# Patient Record
Sex: Male | Born: 1960 | Race: Black or African American | Hispanic: No | Marital: Single | Smoking: Former smoker
Health system: Southern US, Community
[De-identification: ages and names within clinical notes are randomized; demographics above are authoritative.]

## PROBLEM LIST (undated history)

## (undated) DIAGNOSIS — F259 Schizoaffective disorder, unspecified: Secondary | ICD-10-CM

## (undated) DIAGNOSIS — I471 Supraventricular tachycardia: Secondary | ICD-10-CM

## (undated) DIAGNOSIS — E119 Type 2 diabetes mellitus without complications: Secondary | ICD-10-CM

## (undated) DIAGNOSIS — K257 Chronic gastric ulcer without hemorrhage or perforation: Secondary | ICD-10-CM

## (undated) DIAGNOSIS — I4892 Unspecified atrial flutter: Secondary | ICD-10-CM

## (undated) DIAGNOSIS — I493 Ventricular premature depolarization: Secondary | ICD-10-CM

## (undated) HISTORY — DX: Type 2 diabetes mellitus without complications: E11.9

## (undated) HISTORY — DX: Ventricular premature depolarization: I49.3

## (undated) HISTORY — DX: Schizoaffective disorder, unspecified: F25.9

## (undated) HISTORY — DX: Unspecified atrial flutter: I48.92

## (undated) HISTORY — DX: Chronic gastric ulcer without hemorrhage or perforation: K25.7

## (undated) HISTORY — DX: Supraventricular tachycardia: I47.1

---

## 1998-02-07 ENCOUNTER — Other Ambulatory Visit: Admission: RE | Admit: 1998-02-07 | Discharge: 1998-02-07 | Payer: Self-pay | Admitting: Family Medicine

## 2000-08-31 ENCOUNTER — Encounter: Payer: Self-pay | Admitting: Family Medicine

## 2000-08-31 ENCOUNTER — Ambulatory Visit (HOSPITAL_COMMUNITY): Admission: RE | Admit: 2000-08-31 | Discharge: 2000-08-31 | Payer: Self-pay | Admitting: Ophthalmology

## 2002-02-20 ENCOUNTER — Emergency Department (HOSPITAL_COMMUNITY): Admission: EM | Admit: 2002-02-20 | Discharge: 2002-02-20 | Payer: Self-pay | Admitting: Emergency Medicine

## 2002-05-17 ENCOUNTER — Encounter: Payer: Self-pay | Admitting: Nephrology

## 2002-05-17 ENCOUNTER — Encounter: Admission: RE | Admit: 2002-05-17 | Discharge: 2002-05-17 | Payer: Self-pay | Admitting: Nephrology

## 2003-03-06 ENCOUNTER — Emergency Department (HOSPITAL_COMMUNITY): Admission: EM | Admit: 2003-03-06 | Discharge: 2003-03-07 | Payer: Self-pay | Admitting: Emergency Medicine

## 2003-03-07 ENCOUNTER — Encounter: Payer: Self-pay | Admitting: Emergency Medicine

## 2004-02-01 ENCOUNTER — Emergency Department (HOSPITAL_COMMUNITY): Admission: EM | Admit: 2004-02-01 | Discharge: 2004-02-01 | Payer: Self-pay | Admitting: Emergency Medicine

## 2005-01-01 ENCOUNTER — Emergency Department (HOSPITAL_COMMUNITY): Admission: EM | Admit: 2005-01-01 | Discharge: 2005-01-01 | Payer: Self-pay | Admitting: Emergency Medicine

## 2005-05-22 ENCOUNTER — Encounter: Admission: RE | Admit: 2005-05-22 | Discharge: 2005-05-22 | Payer: Self-pay | Admitting: Family Medicine

## 2005-06-03 ENCOUNTER — Emergency Department (HOSPITAL_COMMUNITY): Admission: EM | Admit: 2005-06-03 | Discharge: 2005-06-04 | Payer: Self-pay | Admitting: Emergency Medicine

## 2008-01-19 ENCOUNTER — Inpatient Hospital Stay (HOSPITAL_COMMUNITY): Admission: EM | Admit: 2008-01-19 | Discharge: 2008-01-25 | Payer: Self-pay | Admitting: Emergency Medicine

## 2008-01-23 ENCOUNTER — Ambulatory Visit: Payer: Self-pay | Admitting: Infectious Diseases

## 2009-04-17 ENCOUNTER — Ambulatory Visit (HOSPITAL_COMMUNITY): Admission: RE | Admit: 2009-04-17 | Discharge: 2009-04-17 | Payer: Self-pay | Admitting: Urology

## 2009-04-17 ENCOUNTER — Encounter: Payer: Self-pay | Admitting: Urology

## 2010-12-09 NOTE — H&P (Signed)
NAME:  Frederick Fletcher, Frederick Fletcher NO.:  [id number]   MEDICAL RECORD NO.:  [id number]          PATIENT TYPE:  EMS   LOCATION:  ED                           FACILITY:  [facility]   PHYSICIAN:  Hind I Elsaid, MD      DATE OF BIRTH:  1961-01-14   DATE OF ADMISSION:  01/19/2008  DATE OF DISCHARGE:                              HISTORY & PHYSICAL   CHIEF COMPLAINT:  Altered mental status and fever.   HISTORY OF PRESENT ILLNESS:  This is a 50 year old male who lives in  group home with history of schizo-affective, manic depressive episode,  history of gastroesophageal reflux disease. Apparently the patient was  in good health until yesterday when the care giver noticed the patient's  had become so aggressive with some irritability and anger.  Today the  care giver admitted that the patient had some slurred speech and had  some gait imbalance.  The history was mainly obtained from the care  giver secondary to altered mental status.  The patient was seen on the  emergency room, was found to be lethargic.  He was unable to give full  history.  At this time, the patient is awake but noted to have slurred  speech and able to walk as irregular with some dragging of both feet.  The patient denies any back pain, denies any headache.  Denies any  numbness or weakness of his extremities.  He denies any nausea, vomiting  or abdominal pain.  In the emergency room the patient was noted to have  fever of 101 and T-max of 104.6.  No evidence of sweating or shivering.   PAST MEDICAL HISTORY:  1. Schizo-affective disorder.  2. Manic depressive episode.  3. Gastroesophageal reflux disease.  4. 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  5. History of nicotine dependence.   ALLERGIES:  No known drug allergies.   PAST SURGICAL HISTORY:  Unknown to [place].   MEDICATIONS:  1. Allopurinol 10 mg q. 12 hours.  2. Depakote 500 mg q. A.M. and 2tabs p.o. q.h.s. in addition to      Depakote 250 mg p.o. q.h.s.  3. Zyprexa  10 mg in the morning and 2 tabs at bedtime.  4. Ranitidine 150 mg twice daily.  5. Benzatropine 1 mg p.o. q. 8 hours p.r.n. for stiffness.   SOCIAL HISTORY:  The patient lives in a group home.  Denies any drug  abuse but he smokes cigarette about 1 pack per day.  He used to drink  alcohol and take cocaine or marijuana.   PHYSICAL EXAMINATION:  VITAL SIGNS:  T-max was 104.6, blood pressure  94/64, pulse rate 117, respiratory rate 20, SAT 96% on room air.  GENERAL EXAMINATION:  The patient is not in respiratory distress or  shortness of breath, well developed, well nourished, mildly lethargic.  HEENT:  Pupils equal and reactive to light and accommodation.  Eyes:  Normal appearance. Ears:  There is no discharge.  NECK:  Supple, non meningeal sign and no lymphadenopathy.  CARDIOVASCULAR:  S1, S2, no added murmur.  RESPIRATORY:  Equal air entry and no wheezing.  ABDOMEN:  Soft,  nontender.  Bowel sounds are positive.  EXTREMITIES:  No lower limb edema, peripheral pulses intact.  NEUROLOGICAL:  The patient alert to person, able to verbalize but some  slurred speech.  Could not see any evidence of cranial nerve  dysfunction.  He moves all his extremities.  The patient cooperative  with exam.  There is no overly facial droop.  On gait examination, the  patient seemed unable to do wide stepping.   LABORATORY DATA:  Chest x-ray was negative.  Urinalysis negative.  Lactic acid was negative.  Valproic acids are 132.  CBC:  White blood  cell count 9.5, hemoglobin 14.3, hematocrit 41.3, platelet 70.  CMP:  Sodium 136, potassium 4.1, chloride 102, glucose 115, and BUN and  creatinine 14 and 1.9, AST 98 and ALT 65.  Alkaline phosphatase 59.   ASSESSMENT/PLAN:  1. Febrile episode.  2. Altered mental status with slurred speech.  3. Renal insufficiency.  4. Thrombocytopenia.  5. Mild liver function elevation.   ASSESSMENT/PLAN:  1. The patient admitted to telemetry for evaluation of causes of       altered mental status.  Will get septic workup.  Will get flu A&B      and H1,N1 virus.  Will keep the patient on isolation for now.  On      my examination there is no evidence of meningeal sign.  Will get      MRI of the brain and rule out underlying stroke.  Will continue to      advance the patient empirically on Avelox pending septic workup.  2. Renal insufficiency was possibly secondary to dehydration.  Will      hydrate the patient with intravenous fluids.  3. Thrombocytopenia.  Will get peripheral smear, LDH and PT/INR.  4. Mildly elevated LFTs.  Will get hepatitis profile.  5. Schizo-affective disorder, manic depression disorder. Will continue      with home medication.  6. Further recommendations to be addressed as hospital course.      [name], MD  Electronically Signed     HIE/MEDQ  D:  01/19/2008  T:  01/19/2008  Job:  [id number]

## 2010-12-09 NOTE — Discharge Summary (Signed)
NAME:  Frederick Fletcher, COMER NO.:  [id number]   MEDICAL RECORD NO.:  [id number]          PATIENT TYPE:  INP   LOCATION:  1511                         [facility]AC[facility]   PHYSICIAN:  Hillery Aldo, M.D.   DATE OF BIRTH:  [birth year]   DATE OF ADMISSION:  01/19/2008  DATE OF DISCHARGE:  01/25/2008                               DISCHARGE SUMMARY   PRIMARY CARE PHYSICIAN:  Dr. Bruna Potter.   DISCHARGE DIAGNOSES:  1. H1 N1 influenza.  2. Possible [facility] spotted fever.  3. Thrombocytopenia, resolving.  4. Acute renal insufficiency, resolved.  5. Transaminitis, resolved.  6. Schizoaffective bipolar disorder.   DISCHARGE MEDICATIONS:  1. Tamiflu 75 mg b.i.d. x4 days.  2. Doxycycline 100 mg b.i.d. x6 days.  3. Cogentin 1 mg q.8 hours p.r.n.  4. Depakote 500 mg in the morning, 1250 mg in the evening.  5. Haldol 10 mg q.12 hours.  6. Zantac 150 mg b.i.d.  7. Zyprexa 10 mg in the morning, 20 mg in the evening.   CONSULTATION:  1. Dr. Maurice March of Infectious Diseases.  2. Dr. Jeanie Sewer of Psychiatry.   BRIEF ADMISSION HISTORY OF PRESENT ILLNESS:  The patient is a 50-year-  old male resident of a group home who presented to the hospital with  altered mental status and fever.  Was unable to provide any significant  history, but his caregiver noted that he was slurring his speech and was  lethargic.  Upon initial evaluation in the Emergency Department, he was  found to be febrile and therefore was admitted for further evaluation  and workup.  For the full details, please see the dictated report done  by Dr. Eda Fletcher.   PROCEDURES AND DIAGNOSTIC STUDIES:  1. Chest x-ray on January 19, 2008:  Showed no active disease.  2. MRI of the brain on January 20, 2008:  Showed no acute intracranial      abnormality.  Moderate diffuse cerebral and cerebellar atrophy.  3. Chest x-ray on January 20, 2008:  Showed right basilar subsegmental      atelectasis.  4. Chest x-ray on January 21, 2008:  Showed  stable chest.   DISCHARGE LABORATORY VALUES:  Blood cultures were negative x2 sets.  Sodium was 139, potassium 3.7, chloride 105, bicarb 27, glucose 140, BUN  9, creatinine 0.92.  White blood cell count was 5.6, hemoglobin 11.5,  hematocrit 34.4, platelets 125.   HOSPITAL COURSE:  1. Altered mental status/fever secondary to H1 N1 influenza.  The      patient was admitted and a diagnostic workup was undertaken.  H1 N1      PCR was sent off and subsequently came back positive.  The patient      was put on Tamiflu and a septic workup was undertaken as well.  He      initially was empirically treated with Avelox as well.  At this      time, the patient is back to his baseline and has been asymptomatic      for several days.  2. Questionable [facility] spotted fever.  The patient was seen in  consultation with the Infectious Diseases service.  There was      concern that the patient may have [facility] spotted fever due      to his concurrent thrombocytopenia.  [facility] spotted fever      titers were initially negative, but he will need a followup check      of [facility] spotted fever antibodies 8-10 days after illness      onset to rule this out.  Nevertheless, the patient was empirically      treated with doxycycline and will continue a 10-day course of      therapy.  The patient did not exhibit any rash and he is not      currently complaining of any headache.  3. Thrombocytopenia.  The patient did have a precipitous drop in his      platelet count with onset of illness.  His initial platelet count      was 70 and subsequently dropped to a low value of 56.  With      resolution of his symptoms, his platelet count is returning to      normal.  Consideration was made for the possibility that this was      an adverse reaction to Depakote, but this was ruled out as his      platelet count is rising despite continuing therapy with Depakote.      It is likely that his  thrombocytopenia was a sequelae of influenza      illness.  4. Acute renal insufficiency.  The patient was hydrated and his renal      function returned to normal.  5. Transaminitis.  The patient did have transaminitis on initial      evaluation, mild elevation of AST and ALT values.  This is likely      due to underlying illness as his transaminitis trended back towards      baseline values over the course of his hospitalization.  His ALT      has completely normalized and his AST is declining.  6. Schizoaffective bipolar disorder.  The patient was seen in      consultation with Dr. Jeanie Sewer.  He was maintained on psychoactive      medications.  His mood has been stable.   DISPOSITION:  The patient is medically stable for discharge back to the  group home.  He is not considered to be infectious as he has been  maintained in the hospital for 6 days and his symptoms have been  resolved for greater than 24 hours.  No special precautions were  recommended at this time.      Hillery Aldo, M.D.  Electronically Signed     CR/MEDQ  D:  01/25/2008  T:  01/25/2008  Job:  [id number]   cc:   Dr. Bruna Potter

## 2010-12-09 NOTE — Consult Note (Signed)
NAME:  Frederick Fletcher, Frederick Fletcher NO.:  [id number]   MEDICAL RECORD NO.:  [id number]          PATIENT TYPE:  INP   LOCATION:  1408                         [facility]ACILITY[facility]  [facility]   PHYSICIAN:  Antonietta Breach, M.D.  DATE OF BIRTH:  1961/03/13   DATE OF CONSULTATION:  01/24/2008  DATE OF DISCHARGE:                                 CONSULTATION   REQUESTING PHYSICIAN:  [facility].   REASON FOR CONSULTATION:  The patient has suppressed platelets secondary  to psychotropic medication and needs an alternative medication.   HISTORY OF PRESENT ILLNESS:  Frederick Fletcher was admitted to the [facility] on January 19, 2008 due to fever and elevated liver enzymes  as well as renal insufficiency.  Frederick Fletcher has had decreased  platelets for at least 1 week with his platelets being measured between  50-90.  He has no hallucinations or delusions.  He has no thoughts of harming  himself or others.  He has no racing thoughts.  He is not combative.  He  is cooperative with care.  He does have some vague speech and some  social awkward affect, and mildly odd affect.   PAST PSYCHIATRIC HISTORY:  The patient does have a history of bipolar  mood pattern along with history of psychosis and a diagnosis of  schizoaffective disorder.  He has been tried on multiple psychotropics.  He cannot take lithium due to nephrotoxicity.  He has never been on  Lamictal.  He has been treated effectively with Depakote combined with  Zyprexa.  Acutely Haldol has been added at 10 mg b.i.d.   FAMILY PSYCHIATRIC HISTORY:  None known.   SOCIAL HISTORY:  The patient lives in a group home.  He has a history of  remote alcohol, cocaine and marijuana use on review of the past medical  record.  The patient is single.   PAST MEDICAL HISTORY:  1. Gastroesophageal reflux disease.  2. Renal insufficiency.  3. Thrombocytopenia, rule out [facility] spotted fever.   MEDICATIONS:  MAR is reviewed.  1. The  patient is on Depakote 500 mg b.i.d.  2. Haldol 10 mg b.i.d.  3. Zyprexa 10 during the day and 20 mg at night.   ALLERGIES:  NO KNOWN DRUG ALLERGIES.   LABORATORY DATA:  SGPT 51, SGOT 57, sodium 139, BUN 14, creatinine 1.13,  glucose 132, WBC 3.9, hemoglobin 11.4, platelet count still decreased to  86, hemoglobin A1c within normal limits, TSH and ammonia within normal  limits.  The Depakote level was initially high at 132.8.   DIAGNOSTICS:  His brain MRI without contrast showed no acute  abnormalities.  However, there was moderate cerebral and cerebellar  atrophy.   REVIEW OF SYSTEMS:  Constitutional, head, eyes, ears, nose, throat,  mouth, neurologic, psychiatric cardiovascular, respiratory,  gastrointestinal, genitourinary, skin, musculoskeletal, hematologic,  lymphatic, endocrine, metabolic all unremarkable.   PHYSICAL EXAMINATION:  VITAL SIGNS:  Temperature 98, pulse 64,  respiratory rate 16, blood pressure 110/68, O2 saturation on room air  98%.  GENERAL APPEARANCE:  Frederick Fletcher is a middle-aged male sitting in  his hospital bed with no abnormal  involuntary movements.  The patient  was noted to have some oral buccal smacking abnormal movement.   MENTAL STATUS EXAM:  Frederick Fletcher does have an odd and mildly  awkward affect.  He is alert.  He is oriented to all spheres.  His  attention span is normal.  His concentration is normal.  His memory is  intact to immediate, recent and remote.  His fund of knowledge and  intelligence are below average.  His speech involves normal rate and  prosody without dysarthria.  His affect also has some mild anxiety  component.  His mood is slightly anxious.  However, he has constructive  future goals and normal interests.  Thought process is coherent.  Thought content no thoughts of harming himself.  No thoughts of harming  others.  No delusions, no hallucinations.  Insight is intact for mental  illness and needing medication for  prevention.  His judgment is also  intact.   ASSESSMENT:  AXIS I:  293.83, mood disorder not otherwise specified.  Rule out 295.7, schizoaffective disorder versus a combination of  undifferentiated schizophrenia well controlled and bipolar disorder also  well controlled.  AXIS II:  None.  AXIS III:  See past medical history.  AXIS IV:  General medical.  AXIS V:  55.   Frederick Fletcher is not at risk to harm himself or others.  He agrees to  call emergency services for thoughts of harming himself, thoughts of  harming others or other psychiatric emergency symptoms.   The undersigned provided ego supportive psychotherapy.   The indications, alternatives and adverse effects of Lamictal were  discussed with the patient as a replacement for Depakote including the  risk of a lethal rash.  The patient understands and would like to start  Lamictal if it is required that his Depakote be discontinued.   RECOMMENDATIONS:  1. If the thrombocytopenia is assessed to not be due to any other      factors, would then discontinue the Depakote and allow a washout      period.  Once the washout period was completed, would then start      Lamictal (the risk of a Lamictal rash is increased while combined      with Depakote).  2. Would start Lamictal at 25 mg daily and titrate slowly over 1 month      to 100 mg daily. Further slow titration can be done after that by      the patient psychiatrist.  3. Would ask the social worker to set this patient up with his      psychiatrist within the first week of discharge.  4. Would continue his other psychotropic medications unchanged.      Antonietta Breach, M.D.  Electronically Signed    JW/MEDQ  D:  01/24/2008  T:  01/24/2008  Job:  [id number]

## 2010-12-09 NOTE — Discharge Summary (Signed)
NAME:  Frederick Fletcher, Frederick Fletcher          ACCOUNT NO.:  [id number]   MEDICAL RECORD NO.:  [id number]          PATIENT TYPE:  INP   LOCATION:  1408                         FACILITY:  [facility]   PHYSICIAN:  Frederick I Elsaid, MD      DATE OF BIRTH:  1960/08/12   DATE OF ADMISSION:  01/19/2008  DATE OF DISCHARGE:                               DISCHARGE SUMMARY   INTERIM DISCHARGE SUMMARY:   PRIMARY CARE PHYSICIAN:  Dr. Windle Guard, phone #[phone].   DISCHARGE DIAGNOSES:  1. H1, N1 virus-positive.  2. [facility] spotted fever.  3. Thrombocytopenia, felt to be a combination of side effect from      Depakote and possibly [facility] spotted fever.  4. Acute renal failure, resolved.  5. Schizoaffective disorder.  6. History of gastroesophageal reflux disease.  7. History of PPD POSITIVE.  8. History of anxiety disorder.   DISCHARGE MEDICATIONS:  To be dictated at the date of discharge.   CONSULTATIONS:  1. Infectious Disease consulted for continuous fevers, the possibility      of [facility] spotted fever.  2. Dr. Marlan Palau consulted for psychotic medications,      thrombocytopenia.   HISTORY OF PRESENT ILLNESS:  This is a 50 year old African American male  with a history of schizoaffective, manic depressive episode.  He lives  in a group home.  He was admitted to the hospital for a period of fever  with altered mental status and slurred speech.  His temperature was  found to be 104.6 degrees.  The patient was admitted for evaluation of  febrile episode.  As the outbreak of the H1, N1 virus, a sample was  taken at the emergency room.  A septic workup was done, including blood  culture, urine culture and chest x-ray, which were negative.  Also urine  for streptococcal and throat streptococcal were negative.   HOSPITAL COURSE:  #1 - H1, N1 Positive Virus/[place] Spotted  Fever:  The results of the H1, N1 came back as positive and the patient  was started on January 23, 2008, on Tamiflu.  During his hospitalization,  the patient continued to spike a fever with shivering.  Dr. Fransisco Hertz consulted as per the grouphome supervisor ,  He admitted they found  ticks on the skin of many of group home residents but there is no record  about the patient having a tick on his skin.  Dr. Maurice March consulted and  recommended to start doxycycline 100 mg IV twice daily.  The patient  already started on doxycycline on January 21, 2008.  Today is day number  four of doxycycline.  [facility] spotted fever was negative.  Discussed with Dr. Maurice March what he recommended to repeat [facility]  spotted fever antibody On January 25, 2008, for evaluation of IgM  antibodies.  At this time the patient was placed empirically on  doxycycline and Tamiflu and the patient's fever significantly subsided.  He has had no fever for 24 hours.   #2 - Thrombocytopenia:  This could be a side effect of Depakote or [facility] spotted fever.  The course is really unclear.  The platelets on  the day of admission was 70 and continues to drop to 56.  Today a  significant improvement of the patient's platelets to 86.  Will continue  at this time with Depakote, the patient's dose at home.  If platelets  continue to drop, will discontinue Depakote and as per Dr. Anne Hahn, who  recommended to start the patient on Lamictal and increase the dose  slowly, looking for symptoms of skin rash.  The patient will be placed  back on his dose of Depakote.  If the platelets drop, we need to stop  the Depakote and start Lamictal.  The issue of thrombocytopenia is not  yet clear, whether it is due to the Depakote side effects or to the  [facility] spotted feveror the acute virus illness.   #3 - Acute Renal Failure:  Responded to IV fluids.  It is completely  resolved.   #4 - Altered Mental Status With Period of Slurred Speech:  The patient  had an MRI of his brain without evidence of acute stroke.  During  the  hospitalization, the patient returned to his normal level of mood.   #5 - Schizoaffective Disorder:  Possibly changing Depakote to Lamictal  if evidence does show thrombocytopenia, related to the Depakote.  I did  call Dr. Anne Hahn.  The patient put on Medicare.  The patient has never  had a platelet  count in the past to inquire what the patient's baseline  is.   DISPOSITION:  The patient will be returned to a group home after he is  cleared by infectious disease control.  During a discussion with Dr.  Maurice March, Dr. Maurice March recommended that the patient be isolated for one week  from the beginning of his symptoms.      Frederick Bosie Helper, MD  Electronically Signed     HIE/MEDQ  D:  01/24/2008  T:  01/24/2008  Job:  [id number]

## 2010-12-09 NOTE — Consult Note (Signed)
NAME:  Frederick, Fletcher NO.:  [id number]   MEDICAL RECORD NO.:  [id number]          PATIENT TYPE:  INP   LOCATION:  1511                         [facility]ACILITY:  [facility]   PHYSICIAN:  Antonietta Breach, M.D.  DATE OF BIRTH:  16-Mar-1961   DATE OF CONSULTATION:  01/25/2008  DATE OF DISCHARGE:                                 CONSULTATION   [name] is cooperative with bedside care.  He is not having any  hallucinations or delusions.  He is not having any thoughts of harming  himself or others.  He has intact interests and has hope.  He is not  having any racing thoughts or pressured speech.  He does not have  increased energy.   REVIEW OF SYSTEMS:  NEUROLOGIC:  No rigidity or other extrapyramidal  symptoms.  HEMATOLOGIC/LYMPHATIC:  The patient's platelet count has  improved to 125.   LABORATORY DATA:  Sodium 139, BUN 9, creatinine 0.92, glucose 140, WBC  5.6, hemoglobin 11.5, platelet count 125.   PHYSICAL EXAMINATION:  VITAL SIGNS:  Temperature 98.2, pulse 84,  respiratory rate 20, blood pressure 118/80, O2 saturation on room air  97%.  MENTAL STATUS EXAM:  [name] continues with his odd affect,  however, he is oriented to all spheres.  His eye contact is good.  His  attention span is normal.  His concentration is within normal limits.  Thought process is coherent.  Thought content - no thoughts of harming  himself.  No thoughts of harming others.  No delusions, no  hallucinations.  Judgment is intact.  Insight is intact.   ASSESSMENT:  Schizoaffective disorder versus bipolar disorder with  schizophrenia, stable.   RECOMMENDATIONS:  The patient's decreased platelets regarding Depakote  can be dose dependent therefore if the patient's platelets begin  decreasing or if the patient redevelops lethargy or ataxia with slurred  speech the Depakote should be reduced to 500 mg q. a.m. and 750 mg daily  at bedtime.  Once the patient has settled in his discharge  environment  would have the long term goal of tapering off his Haldol and reducing  his Zyprexa to as low as possible.  However, this should be managed by  his outpatient psychiatrist.   Outpatient psychiatric followup can be found at one of the clinics  attached to [facility], [facility] or [facility] as well  as the [facility].      Antonietta Breach, M.D.  Electronically Signed     JW/MEDQ  D:  01/25/2008  T:  01/25/2008  Job:  [id number]

## 2011-04-23 LAB — CBC
HCT: 33 — ABNORMAL LOW
HCT: 35.4 — ABNORMAL LOW
HCT: 41.3
Hemoglobin: 11.3 — ABNORMAL LOW
Hemoglobin: 11.4 — ABNORMAL LOW
Hemoglobin: 11.5 — ABNORMAL LOW
Hemoglobin: 11.8 — ABNORMAL LOW
Hemoglobin: 12.2 — ABNORMAL LOW
Hemoglobin: 14.3
MCHC: 34.1
MCHC: 34.2
MCHC: 34.8
MCHC: 33.5
MCV: 95.1
MCV: 95.5
MCV: 95.7
MCV: 95.8
MCV: 97.2
Platelets: 63 — ABNORMAL LOW
Platelets: 70 — ABNORMAL LOW
RBC: 3.46 — ABNORMAL LOW
RBC: 3.47 — ABNORMAL LOW
RBC: 3.51 — ABNORMAL LOW
RBC: 3.58 — ABNORMAL LOW
RBC: 4.31
RBC: 3.54 — ABNORMAL LOW
RDW: 14.8
RDW: 14.8
WBC: 3.1 — ABNORMAL LOW
WBC: 3.9 — ABNORMAL LOW
WBC: 4.2
WBC: 5.4
WBC: 9.5

## 2011-04-23 LAB — URINALYSIS, ROUTINE W REFLEX MICROSCOPIC
Bilirubin Urine: NEGATIVE
Bilirubin Urine: NEGATIVE
Glucose, UA: NEGATIVE
Hgb urine dipstick: NEGATIVE
Ketones, ur: NEGATIVE
Ketones, ur: NEGATIVE
Nitrite: NEGATIVE
Nitrite: NEGATIVE
Protein, ur: NEGATIVE
Protein, ur: NEGATIVE
Specific Gravity, Urine: 1.017
Urobilinogen, UA: 2 — ABNORMAL HIGH
pH: 6
pH: 6.5

## 2011-04-23 LAB — CULTURE, BLOOD (ROUTINE X 2)
Culture: NO GROWTH
Culture: NO GROWTH
Culture: NO GROWTH
Culture: NO GROWTH

## 2011-04-23 LAB — PROTIME-INR: Prothrombin Time: 15.3 — ABNORMAL HIGH

## 2011-04-23 LAB — DIFFERENTIAL
Band Neutrophils: 0
Basophils Relative: 0
Basophils Relative: 0
Blasts: 0
Eosinophils Relative: 0
Lymphocytes Relative: 25
Metamyelocytes Relative: 0
Monocytes Relative: 22 — ABNORMAL HIGH
Monocytes Relative: 7
Myelocytes: 0
Neutrophils Relative %: 68
Promyelocytes Absolute: 0
nRBC: 0

## 2011-04-23 LAB — BLOOD GAS, ARTERIAL
Acid-Base Excess: 1.4
Bicarbonate: 23.4
Drawn by: 12980
FIO2: 0.21
O2 Saturation: 94.2
Patient temperature: 104.6
TCO2: 20.2
pCO2 arterial: 35.4
pH, Arterial: 7.45
pO2, Arterial: 82.9

## 2011-04-23 LAB — ROCKY MTN SPOTTED FVR AB, IGM-BLOOD: RMSF IgM: 0.5 IV

## 2011-04-23 LAB — BASIC METABOLIC PANEL
CO2: 31
CO2: 27
Calcium: 8.9
Chloride: 107
Chloride: 105
Creatinine, Ser: 1.43
Creatinine, Ser: 0.92
GFR calc Af Amer: >60
GFR calc Af Amer: >60
GFR calc Af Amer: >60
Glucose, Bld: 140 — ABNORMAL HIGH
Potassium: 4.7
Sodium: 139
Sodium: 144

## 2011-04-23 LAB — URINE CULTURE
Colony Count: NO GROWTH
Colony Count: NO GROWTH
Culture: NO GROWTH

## 2011-04-23 LAB — COMPREHENSIVE METABOLIC PANEL
ALT: 51
ALT: 65 — ABNORMAL HIGH
AST: 57 — ABNORMAL HIGH
AST: 98 — ABNORMAL HIGH
Albumin: 2.4 — ABNORMAL LOW
Alkaline Phosphatase: 59
BUN: 12
CO2: 24
CO2: 26
Chloride: 102
Chloride: 107
Creatinine, Ser: 1.26
Creatinine, Ser: 1.44
GFR calc Af Amer: 45 — ABNORMAL LOW
GFR calc Af Amer: >60
GFR calc non Af Amer: 37 — ABNORMAL LOW
GFR calc non Af Amer: >60
Glucose, Bld: 114 — ABNORMAL HIGH
Glucose, Bld: 127 — ABNORMAL HIGH
Potassium: 4.4
Sodium: 136
Sodium: 139
Total Bilirubin: 0.6
Total Bilirubin: 0.9
Total Protein: 5.6 — ABNORMAL LOW

## 2011-04-23 LAB — CARDIAC PANEL(CRET KIN+CKTOT+MB+TROPI)
CK, MB: 0.9
CK, MB: 1.5
Relative Index: 0.4
Relative Index: 1.1
Total CK: 121
Total CK: 144
Troponin I: 0.01
Troponin I: <0.01

## 2011-04-23 LAB — LIPID PANEL
HDL: 21 — ABNORMAL LOW
Triglycerides: 82
VLDL: 16

## 2011-04-23 LAB — HEPARIN ANTIBODY SCREEN: Heparin Antibody Screen: NEGATIVE

## 2011-04-23 LAB — STREP A DNA PROBE: Group A Strep Probe: NEGATIVE

## 2011-04-23 LAB — COMPREHENSIVE METABOLIC PANEL WITH GFR
Albumin: 3.2 — ABNORMAL LOW
BUN: 14
Calcium: 9.4
Creatinine, Ser: 1.94 — ABNORMAL HIGH
Total Protein: 7

## 2011-04-23 LAB — ROCKY MTN SPOTTED FVR AB, IGG-BLOOD: RMSF IgG: <1:64 {titer}

## 2011-04-23 LAB — HEMOGLOBIN A1C: Mean Plasma Glucose: 136

## 2011-04-23 LAB — TECHNOLOGIST SMEAR REVIEW

## 2011-04-23 LAB — AMMONIA: Ammonia: 25

## 2011-04-23 LAB — DIC (DISSEMINATED INTRAVASCULAR COAGULATION)PANEL
INR: 1.1
Smear Review: NONE SEEN
aPTT: 63 — ABNORMAL HIGH

## 2011-04-23 LAB — HOMOCYSTEINE: Homocysteine: 8

## 2011-04-23 LAB — VALPROIC ACID LEVEL: Valproic Acid Lvl: 132.8 — ABNORMAL HIGH

## 2011-04-23 LAB — PHOSPHORUS: Phosphorus: 3.8

## 2011-04-23 LAB — EHRLICHIA ANTIBODY PANEL: E chaffeensis (HGE) Ab, IgG: <1:64 {titer}

## 2011-04-23 LAB — LACTIC ACID, PLASMA: Lactic Acid, Venous: 1.8

## 2011-06-24 ENCOUNTER — Telehealth: Payer: Self-pay | Admitting: *Deleted

## 2011-06-24 NOTE — Progress Notes (Deleted)
Dr. Roselind Messier called pt and will switch to zofran po prn ,pt has this on hand already, and will call back on Friday if not any better 3:24 PM

## 2011-06-24 NOTE — Progress Notes (Signed)
Error in charting, wrong pt

## 2011-06-25 NOTE — Telephone Encounter (Signed)
Error, wrong chart

## 2014-10-10 DIAGNOSIS — F250 Schizoaffective disorder, bipolar type: Secondary | ICD-10-CM | POA: Diagnosis not present

## 2014-12-11 DIAGNOSIS — F250 Schizoaffective disorder, bipolar type: Secondary | ICD-10-CM | POA: Diagnosis not present

## 2015-03-13 DIAGNOSIS — F250 Schizoaffective disorder, bipolar type: Secondary | ICD-10-CM | POA: Diagnosis not present

## 2015-04-22 DIAGNOSIS — F259 Schizoaffective disorder, unspecified: Secondary | ICD-10-CM | POA: Diagnosis not present

## 2015-04-22 DIAGNOSIS — Z0000 Encounter for general adult medical examination without abnormal findings: Secondary | ICD-10-CM | POA: Diagnosis not present

## 2015-04-22 DIAGNOSIS — Z23 Encounter for immunization: Secondary | ICD-10-CM | POA: Diagnosis not present

## 2015-06-13 DIAGNOSIS — F250 Schizoaffective disorder, bipolar type: Secondary | ICD-10-CM | POA: Diagnosis not present

## 2015-09-05 DIAGNOSIS — F250 Schizoaffective disorder, bipolar type: Secondary | ICD-10-CM | POA: Diagnosis not present

## 2015-11-25 DIAGNOSIS — F250 Schizoaffective disorder, bipolar type: Secondary | ICD-10-CM | POA: Diagnosis not present

## 2015-12-05 DIAGNOSIS — Z79899 Other long term (current) drug therapy: Secondary | ICD-10-CM | POA: Diagnosis not present

## 2015-12-05 DIAGNOSIS — F209 Schizophrenia, unspecified: Secondary | ICD-10-CM | POA: Diagnosis not present

## 2015-12-05 DIAGNOSIS — F419 Anxiety disorder, unspecified: Secondary | ICD-10-CM | POA: Diagnosis not present

## 2016-01-21 DIAGNOSIS — F209 Schizophrenia, unspecified: Secondary | ICD-10-CM | POA: Diagnosis not present

## 2016-01-21 DIAGNOSIS — Z1211 Encounter for screening for malignant neoplasm of colon: Secondary | ICD-10-CM | POA: Diagnosis not present

## 2016-01-21 DIAGNOSIS — R7301 Impaired fasting glucose: Secondary | ICD-10-CM | POA: Diagnosis not present

## 2016-02-24 DIAGNOSIS — F250 Schizoaffective disorder, bipolar type: Secondary | ICD-10-CM | POA: Diagnosis not present

## 2016-03-02 DIAGNOSIS — Z1211 Encounter for screening for malignant neoplasm of colon: Secondary | ICD-10-CM | POA: Diagnosis not present

## 2016-03-02 DIAGNOSIS — K219 Gastro-esophageal reflux disease without esophagitis: Secondary | ICD-10-CM | POA: Diagnosis not present

## 2016-04-08 DIAGNOSIS — F259 Schizoaffective disorder, unspecified: Secondary | ICD-10-CM | POA: Diagnosis not present

## 2016-04-08 DIAGNOSIS — Z23 Encounter for immunization: Secondary | ICD-10-CM | POA: Diagnosis not present

## 2016-05-20 DIAGNOSIS — F250 Schizoaffective disorder, bipolar type: Secondary | ICD-10-CM | POA: Diagnosis not present

## 2016-07-08 DIAGNOSIS — F250 Schizoaffective disorder, bipolar type: Secondary | ICD-10-CM | POA: Diagnosis not present

## 2016-07-14 DIAGNOSIS — F250 Schizoaffective disorder, bipolar type: Secondary | ICD-10-CM | POA: Diagnosis not present

## 2016-08-18 DIAGNOSIS — F250 Schizoaffective disorder, bipolar type: Secondary | ICD-10-CM | POA: Diagnosis not present

## 2016-09-15 DIAGNOSIS — F250 Schizoaffective disorder, bipolar type: Secondary | ICD-10-CM | POA: Diagnosis not present

## 2016-09-23 DIAGNOSIS — F250 Schizoaffective disorder, bipolar type: Secondary | ICD-10-CM | POA: Diagnosis not present

## 2016-10-13 DIAGNOSIS — F250 Schizoaffective disorder, bipolar type: Secondary | ICD-10-CM | POA: Diagnosis not present

## 2016-11-10 DIAGNOSIS — F250 Schizoaffective disorder, bipolar type: Secondary | ICD-10-CM | POA: Diagnosis not present

## 2016-11-30 DIAGNOSIS — F250 Schizoaffective disorder, bipolar type: Secondary | ICD-10-CM | POA: Diagnosis not present

## 2016-12-10 DIAGNOSIS — F250 Schizoaffective disorder, bipolar type: Secondary | ICD-10-CM | POA: Diagnosis not present

## 2017-01-14 DIAGNOSIS — F250 Schizoaffective disorder, bipolar type: Secondary | ICD-10-CM | POA: Diagnosis not present

## 2017-02-11 DIAGNOSIS — F250 Schizoaffective disorder, bipolar type: Secondary | ICD-10-CM | POA: Diagnosis not present

## 2017-03-11 DIAGNOSIS — F250 Schizoaffective disorder, bipolar type: Secondary | ICD-10-CM | POA: Diagnosis not present

## 2017-04-05 DIAGNOSIS — R7309 Other abnormal glucose: Secondary | ICD-10-CM | POA: Diagnosis not present

## 2017-04-05 DIAGNOSIS — Z23 Encounter for immunization: Secondary | ICD-10-CM | POA: Diagnosis not present

## 2017-04-05 DIAGNOSIS — Z0000 Encounter for general adult medical examination without abnormal findings: Secondary | ICD-10-CM | POA: Diagnosis not present

## 2017-04-05 DIAGNOSIS — Z1211 Encounter for screening for malignant neoplasm of colon: Secondary | ICD-10-CM | POA: Diagnosis not present

## 2017-04-08 DIAGNOSIS — F250 Schizoaffective disorder, bipolar type: Secondary | ICD-10-CM | POA: Diagnosis not present

## 2017-05-07 DIAGNOSIS — F250 Schizoaffective disorder, bipolar type: Secondary | ICD-10-CM | POA: Diagnosis not present

## 2017-06-03 DIAGNOSIS — F250 Schizoaffective disorder, bipolar type: Secondary | ICD-10-CM | POA: Diagnosis not present

## 2017-07-01 DIAGNOSIS — F250 Schizoaffective disorder, bipolar type: Secondary | ICD-10-CM | POA: Diagnosis not present

## 2017-07-29 DIAGNOSIS — F250 Schizoaffective disorder, bipolar type: Secondary | ICD-10-CM | POA: Diagnosis not present

## 2017-08-26 DIAGNOSIS — F250 Schizoaffective disorder, bipolar type: Secondary | ICD-10-CM | POA: Diagnosis not present

## 2017-09-08 DIAGNOSIS — F250 Schizoaffective disorder, bipolar type: Secondary | ICD-10-CM | POA: Diagnosis not present

## 2017-09-23 DIAGNOSIS — F250 Schizoaffective disorder, bipolar type: Secondary | ICD-10-CM | POA: Diagnosis not present

## 2017-10-21 DIAGNOSIS — F250 Schizoaffective disorder, bipolar type: Secondary | ICD-10-CM | POA: Diagnosis not present

## 2017-10-25 DIAGNOSIS — Z5181 Encounter for therapeutic drug level monitoring: Secondary | ICD-10-CM | POA: Diagnosis not present

## 2017-10-25 DIAGNOSIS — F250 Schizoaffective disorder, bipolar type: Secondary | ICD-10-CM | POA: Diagnosis not present

## 2017-10-25 DIAGNOSIS — Z79899 Other long term (current) drug therapy: Secondary | ICD-10-CM | POA: Diagnosis not present

## 2017-11-18 DIAGNOSIS — F250 Schizoaffective disorder, bipolar type: Secondary | ICD-10-CM | POA: Diagnosis not present

## 2017-11-29 DIAGNOSIS — F250 Schizoaffective disorder, bipolar type: Secondary | ICD-10-CM | POA: Diagnosis not present

## 2017-12-16 DIAGNOSIS — F250 Schizoaffective disorder, bipolar type: Secondary | ICD-10-CM | POA: Diagnosis not present

## 2017-12-22 ENCOUNTER — Emergency Department (HOSPITAL_COMMUNITY)
Admission: EM | Admit: 2017-12-22 | Discharge: 2017-12-24 | Disposition: A | Discharge status: Home / Self Care | Payer: Medicare Other | Attending: Emergency Medicine | Admitting: Emergency Medicine

## 2017-12-22 ENCOUNTER — Other Ambulatory Visit: Payer: Self-pay

## 2017-12-22 ENCOUNTER — Encounter (HOSPITAL_COMMUNITY): Payer: Self-pay | Admitting: Emergency Medicine

## 2017-12-22 DIAGNOSIS — F17210 Nicotine dependence, cigarettes, uncomplicated: Secondary | ICD-10-CM | POA: Diagnosis not present

## 2017-12-22 DIAGNOSIS — Z79899 Other long term (current) drug therapy: Secondary | ICD-10-CM | POA: Insufficient documentation

## 2017-12-22 DIAGNOSIS — F29 Unspecified psychosis not due to a substance or known physiological condition: Secondary | ICD-10-CM | POA: Diagnosis not present

## 2017-12-22 DIAGNOSIS — F250 Schizoaffective disorder, bipolar type: Secondary | ICD-10-CM | POA: Diagnosis not present

## 2017-12-22 DIAGNOSIS — R45851 Suicidal ideations: Secondary | ICD-10-CM

## 2017-12-22 LAB — RAPID URINE DRUG SCREEN, HOSP PERFORMED
Amphetamines: NOT DETECTED
BARBITURATES: NOT DETECTED
Benzodiazepines: NOT DETECTED
Cocaine: NOT DETECTED
Opiates: NOT DETECTED
Tetrahydrocannabinol: NOT DETECTED

## 2017-12-22 LAB — COMPREHENSIVE METABOLIC PANEL
ALK PHOS: 61 U/L (ref 38–126)
ALT: 11 U/L — AB (ref 17–63)
AST: 18 U/L (ref 15–41)
Albumin: 3.4 g/dL — ABNORMAL LOW (ref 3.5–5.0)
Anion gap: 6 (ref 5–15)
BUN: 16 mg/dL (ref 6–20)
CALCIUM: 9.3 mg/dL (ref 8.9–10.3)
CHLORIDE: 99 mmol/L — AB (ref 101–111)
CO2: 29 mmol/L (ref 22–32)
CREATININE: 1.04 mg/dL (ref 0.61–1.24)
Glucose, Bld: 116 mg/dL — ABNORMAL HIGH (ref 65–99)
Potassium: 4.2 mmol/L (ref 3.5–5.1)
Sodium: 134 mmol/L — ABNORMAL LOW (ref 135–145)
Total Bilirubin: 0.4 mg/dL (ref 0.3–1.2)
Total Protein: 7.4 g/dL (ref 6.5–8.1)

## 2017-12-22 LAB — CBC WITH DIFFERENTIAL/PLATELET
Basophils Absolute: 0 10*3/uL (ref 0.0–0.1)
Basophils Relative: 0 %
EOS PCT: 1 %
Eosinophils Absolute: 0 10*3/uL (ref 0.0–0.7)
HEMATOCRIT: 35.5 % — AB (ref 39.0–52.0)
Hemoglobin: 12 g/dL — ABNORMAL LOW (ref 13.0–17.0)
LYMPHS PCT: 29 %
Lymphs Abs: 1.6 10*3/uL (ref 0.7–4.0)
MCH: 32.6 pg (ref 26.0–34.0)
MCHC: 33.8 g/dL (ref 30.0–36.0)
MCV: 96.5 fL (ref 78.0–100.0)
MONO ABS: 0.7 10*3/uL (ref 0.1–1.0)
MONOS PCT: 13 %
NEUTROS ABS: 3.2 10*3/uL (ref 1.7–7.7)
Neutrophils Relative %: 57 %
PLATELETS: 142 10*3/uL — AB (ref 150–400)
RBC: 3.68 MIL/uL — ABNORMAL LOW (ref 4.22–5.81)
RDW: 14 % (ref 11.5–15.5)
WBC: 5.6 10*3/uL (ref 4.0–10.5)

## 2017-12-22 LAB — ACETAMINOPHEN LEVEL

## 2017-12-22 LAB — SALICYLATE LEVEL

## 2017-12-22 LAB — ETHANOL

## 2017-12-22 MED ORDER — DIVALPROEX SODIUM ER 500 MG PO TB24
500.0000 mg | ORAL_TABLET | Freq: Three times a day (TID) | ORAL | Status: DC
  Administered 2017-12-22 – 2017-12-24 (×6): 500 mg via ORAL
  Filled 2017-12-22 (×6): qty 1

## 2017-12-22 MED ORDER — CLONAZEPAM 0.5 MG PO TABS: 0.2500 mg | ORAL_TABLET | Freq: Two times a day (BID) | ORAL | Status: DC | PRN

## 2017-12-22 MED ORDER — ACETAMINOPHEN 325 MG PO TABS: 650.0000 mg | ORAL_TABLET | ORAL | Status: DC | PRN

## 2017-12-22 MED ORDER — ALUM & MAG HYDROXIDE-SIMETH 200-200-20 MG/5ML PO SUSP: 30.0000 mL | Freq: Four times a day (QID) | ORAL | Status: DC | PRN

## 2017-12-22 MED ORDER — NICOTINE 21 MG/24HR TD PT24: 21.0000 mg | MEDICATED_PATCH | Freq: Every day | TRANSDERMAL | Status: DC | PRN

## 2017-12-22 MED ORDER — BENZTROPINE MESYLATE 1 MG PO TABS
0.5000 mg | ORAL_TABLET | Freq: Two times a day (BID) | ORAL | Status: DC
  Administered 2017-12-22 – 2017-12-24 (×4): 0.5 mg via ORAL
  Filled 2017-12-22 (×4): qty 1

## 2017-12-22 MED ORDER — CLONAZEPAM 0.5 MG PO TABS: 0.5000 mg | ORAL_TABLET | Freq: Two times a day (BID) | ORAL | Status: DC | PRN

## 2017-12-22 MED ORDER — ZOLPIDEM TARTRATE 5 MG PO TABS: 5.0000 mg | ORAL_TABLET | Freq: Every evening | ORAL | Status: DC | PRN

## 2017-12-22 MED ORDER — OLANZAPINE 5 MG PO TABS
20.0000 mg | ORAL_TABLET | Freq: Two times a day (BID) | ORAL | Status: DC
  Administered 2017-12-22 – 2017-12-24 (×4): 20 mg via ORAL
  Filled 2017-12-22 (×4): qty 4

## 2017-12-22 MED ORDER — FAMOTIDINE 20 MG PO TABS
40.0000 mg | ORAL_TABLET | Freq: Every day | ORAL | Status: DC
  Administered 2017-12-22 – 2017-12-23 (×2): 40 mg via ORAL
  Filled 2017-12-22 (×2): qty 2

## 2017-12-22 NOTE — ED Notes (Signed)
Patient ambulatory to bathroom to provide urine sample. Pt pleasant and cooperative. Patient given snack and drink.

## 2017-12-22 NOTE — ED Triage Notes (Signed)
Patient states he is suicidal and no longer wants to live at the home he is in. His land lady called the police stating that Frederick Fletcher wanted help because he wanted to commit suicide. He states he has as straight razor and slice his arm at the elbow.

## 2017-12-22 NOTE — Progress Notes (Signed)
CSW received call from [facility] who stated that pt has no more Medicare days and would need to self-pay if accepted.   CSW will continue to assist with placement needs.   Audree Camel, LCSW, [place] [facility]/TTS [phone]

## 2017-12-22 NOTE — Progress Notes (Signed)
Pt meets inpatient criteria per Earleen Newport, NP. Referral information has been sent to the following hospitals: [facility]  [facility]   Disposition CSW will continue to assist with placement needs.   Audree Camel, LCSW, [place] Disposition [facility]/TTS [phone]

## 2017-12-22 NOTE — BH Assessment (Addendum)
Assessment Note  Frederick Fletcher is a single 57 y.o. male who presents to [facility] stating he is having SI with plan to cut himself. He reports he is in the hospital because "I am paranoid schizophrenic & my mind shuts down on me sometimes."  He speaks quickly in long sentences that are difficult to understand.  Part of a sentence may make sense, but then derail or the opposite- nonsense will form into a logical expression of thought.  Initially, pt was talking about mennonites with little input from this Probation officer. He is difficult to redirect at times, but will eventually catch on to what is being asked to him, and respond, even if briefly. Pt reports proudly he does not use drugs anymore. It's not very clear, but seemed like pt was reporting occasional alcohol use. Pt also states he has too much blood in the back of his head. He reports previous suicide attempts "so many times" & an attempt in 6063 when he sliced open his arm with a steak knife. Pt reports he see Dr. Renato Fletcher q 2 weeks for meds. He states "Clozaril helps". Pt reports HI toward 2 women- Frederick Fletcher, who may be landlord. Pt states he cannot return there, does not want to return there. Pt states she is mean as hell and is scared of me bc I threw a chair at her. Other woman may be a friend of pt's sister, "Frederick Fletcher". Pt states he will kill her. Both situations seems to involve money & pt's perception they took from him. Pt was as cooperative as possible and pleasant.     Diagnosis:  F25.0 Schizoaffective disorder, Bipolar type Disposition: Per Frederick Rankin, NP, Pt is recommended for inpt tx.   Past Medical History: History reviewed. No pertinent past medical history.  History reviewed. No pertinent surgical history.  Family History: No family history on file.  Social History:  reports that he has been smoking cigarettes.  He has been smoking about 1.00 pack per day. He has never used smokeless tobacco. He reports that he drank alcohol. He  reports that he does not use drugs.  Additional Social History:  Alcohol / Drug Use Pain Medications: see MAR Prescriptions: see MAR Over the Counter: see MAR History of alcohol / drug use?: Yes Substance #1 Name of Substance 1: alcohol 1 - Age of First Use: UTA 1 - Frequency: UTA 1 - Duration: UTA 1 - Last Use / Amount: 2-3 days ago/ unknown  CIWA: CIWA-Ar BP: 102/76 Pulse Rate: 76 COWS:    Allergies: No Known Allergies  Home Medications:  (Not in a hospital admission)  OB/GYN Status:  No LMP for male patient.  General Assessment Data Location of Assessment: [facility] TTS Assessment: In system Is this a Tele or Face-to-Face Assessment?: Tele Assessment Is this an Initial Assessment or a Re-assessment for this encounter?: Initial Assessment Marital status: Single Living Arrangements: Non-relatives/Friends Can pt return to current living arrangement?: No(Pt states woman he lived with is mad at him now) Admission Status: Voluntary Is patient capable of signing voluntary admission?: Yes Referral Source: Self/Family/Friend Insurance type: medicare     Crisis Care Plan Living Arrangements: Non-relatives/Friends  Education Status Is patient currently in school?: No Is the patient employed, unemployed or receiving disability?: Receiving disability income  Risk to self with the past 6 months Suicidal Ideation: Yes-Currently Present Has patient been a risk to self within the past 6 months prior to admission? : Yes Suicidal Intent: Yes-Currently Present Has patient had any  suicidal intent within the past 6 months prior to admission? : Yes Is patient at risk for suicide?: Yes Suicidal Plan?: Yes-Currently Present Has patient had any suicidal plan within the past 6 months prior to admission? : Yes Specify Current Suicidal Plan: ("cut self") Access to Means: Yes What has been your use of drugs/alcohol within the last 12 months?: pt reports occasional alcohol use Previous  Attempts/Gestures: Yes How many times?: (UTA) Triggers for Past Attempts: Unknown Intentional Self Injurious Behavior: None Family Suicide History: No("just me") Recent stressful life event(s): Financial Problems Persecutory voices/beliefs?: Yes(beliefs) Depression: Yes(pt states he believes so) Depression Symptoms: Isolating, Feeling angry/irritable, Feeling worthless/self pity, Despondent Substance abuse history and/or treatment for substance abuse?: Yes Suicide prevention information given to non-admitted patients: Yes  Risk to Others within the past 6 months Homicidal Ideation: Yes-Currently Present Does patient have any lifetime risk of violence toward others beyond the six months prior to admission? : Yes (comment) Thoughts of Harm to Others: Yes-Currently Present Comment - Thoughts of Harm to Others: Pt specifically angry at [employer] women he knows- stated clearly he wanted to kill Frederick Fletcher) Current Homicidal Intent: Yes-Currently Present Current Homicidal Plan: (UTA) Access to Homicidal Means: (UTA) Identified Victim: Frederick Fletcher History of harm to others?: Yes(per pt) Assessment of Violence: On admission(pt reports he threw chair at land lady) Does patient have access to weapons?: No(denies current gun ownership) Criminal Charges Pending?: No Does patient have a court date: No Is patient on probation?: No  Psychosis Hallucinations: (UTA) Delusions: (UTA)  Mental Status Report Appearance/Hygiene: Disheveled Eye Contact: Good Motor Activity: Hyperactivity, Restlessness Speech: Rapid, Slurred Level of Consciousness: Restless, Alert Mood: Labile, Pleasant, Suspicious Affect: Angry, Threatening, Labile, Euphoric Anxiety Level: Minimal Thought Processes: Flight of Ideas, Circumstantial, Coherent Judgement: Partial Orientation: Person, Place, Time, Situation Obsessive Compulsive Thoughts/Behaviors: None  Cognitive Functioning Concentration: Decreased Memory:  Recent Intact, Remote Intact Is patient IDD: No Is patient DD?: No Insight: Fair Impulse Control: Poor Appetite: (UTA) Sleep: Decreased  ADLScreening ([facility] Assessment Services) Patient's cognitive ability adequate to safely complete daily activities?: Yes Patient able to express need for assistance with ADLs?: Yes Independently performs ADLs?: Yes (appropriate for developmental age)  Prior Inpatient Therapy Prior Inpatient Therapy: Yes Prior Therapy Dates: multiple Prior Therapy Facilty/Provider(s): multiple Reason for Treatment: MH tx  Prior Outpatient Therapy Prior Outpatient Therapy: (UTA)  ADL Screening (condition at time of admission) Patient's cognitive ability adequate to safely complete daily activities?: Yes Is the patient deaf or have difficulty hearing?: No Does the patient have difficulty seeing, even when wearing glasses/contacts?: No Does the patient have difficulty concentrating, remembering, or making decisions?: No Patient able to express need for assistance with ADLs?: Yes Does the patient have difficulty dressing or bathing?: No Independently performs ADLs?: Yes (appropriate for developmental age) Does the patient have difficulty walking or climbing stairs?: No Weakness of Legs: None Weakness of Arms/Hands: None  Home Assistive Devices/Equipment Home Assistive Devices/Equipment: None  Therapy Consults (therapy consults require a physician order) PT Evaluation Needed: No OT Evalulation Needed: No SLP Evaluation Needed: No Abuse/Neglect Assessment (Assessment to be complete while patient is alone) Abuse/Neglect Assessment Can Be Completed: Yes Physical Abuse: Denies Verbal Abuse: Denies Sexual Abuse: Denies Exploitation of patient/patient's resources: Denies Self-Neglect: Denies Values / Beliefs Cultural Requests During Hospitalization: None Spiritual Requests During Hospitalization: None Consults Spiritual Care Consult Needed: No Social Work  Consult Needed: No Regulatory affairs officer (For Healthcare) Does Patient Have a Medical Advance Directive?: No Would patient like information on  creating a medical advance directive?: No - Patient declined    Additional Information 1:1 In Past 12 Months?: (UTA) CIRT Risk: (UTA) Elopement Risk: (UTA) Does patient have medical clearance?: Yes     Disposition:  Disposition Initial Assessment Completed for this Encounter: Yes Disposition of Patient: Admit Type of inpatient treatment program: Adult Patient refused recommended treatment: No  On Site Evaluation by:   Reviewed with Physician:    Richardean Chimera 12/22/2017 5:01 PM

## 2017-12-22 NOTE — ED Notes (Signed)
Patient given dinner tray.

## 2017-12-22 NOTE — ED Notes (Signed)
Patient is having conversations with people who are not in the room. Patient is pleasant.

## 2017-12-22 NOTE — ED Provider Notes (Signed)
[facility] EMERGENCY DEPARTMENT Provider Note   CSN: [id number] Arrival date & time: 12/22/17  0840     History   Chief Complaint Chief Complaint  Patient presents with  . Suicide Attempt    HPI Frederick Fletcher is a 57 y.o. male.  The history is provided by the patient. The history is limited by the condition of the patient (psychiatric disorder).    Pt was seen at 1000. Per pt, c/o gradual onset and persistence of constant depression and SI for the past several days. Pt has plan to cut himself with a straight razor at the elbow. Pt's landlady called Police stating pt was SI. Pt does endorse hallucinations of wanting to hurt himself and others.  Denies SA.    History reviewed. No pertinent past medical history.  There are no active problems to display for this patient.   History reviewed. No pertinent surgical history.      Home Medications    Prior to Admission medications   Medication Sig Start Date End Date Taking? Authorizing Provider  benztropine (COGENTIN) 0.5 MG tablet Take 0.5 mg by mouth 2 (two) times daily.   Yes [provider]  clonazePAM (KLONOPIN) 0.5 MG tablet Take 0.25 mg by mouth 2 (two) times daily as needed for anxiety.   Yes [provider]  divalproex (DEPAKOTE ER) 500 MG 24 hr tablet Take 500 mg by mouth 3 (three) times daily.   Yes [provider]  OLANZapine (ZYPREXA) 20 MG tablet Take 20 mg by mouth 2 (two) times daily.   Yes [provider]  ranitidine (ZANTAC) 300 MG tablet Take 300 mg by mouth at bedtime as needed for heartburn.   Yes [provider]    Family History No family history on file.  Social History Social History   Tobacco Use  . Smoking status: Current Every Day Smoker    Packs/day: 1.00    Types: Cigarettes  . Smokeless tobacco: Never Used  Substance Use Topics  . Alcohol use: Not Currently    Frequency: Never  . Drug use: Never     Allergies   Patient has no  known allergies.   Review of Systems Review of Systems  Unable to perform ROS: Psychiatric disorder          Physical Exam Updated Vital Signs BP (!) 104/49 (BP Location: Right Arm)   Pulse 87   Temp 98.1 F (36.7 C) (Oral)   Resp 16   Ht 6\' 3"  (1.905 m)   Wt 71.7 kg (158 lb)   SpO2 97%   BMI 19.75 kg/m   Physical Exam 1005; Physical examination:  Nursing notes reviewed; Vital signs and O2 SAT reviewed;  Constitutional: Well developed, Well nourished, Well hydrated, In no acute distress; Head:  Normocephalic, atraumatic; Eyes: EOMI, PERRL, No scleral icterus; ENMT: Mouth and pharynx normal, Mucous membranes moist; Neck: Supple, Full range of motion; Cardiovascular: Regular rate and rhythm; Respiratory: Breath sounds clear, No wheezes.  Speaking full sentences with ease, Normal respiratory effort/excursion; Chest: No deformity, Movement normal; Abdomen: Nondistended; Extremities: No deformity.; Neuro: AA&Ox3, Major CN grossly intact.  Speech clear. No gross focal motor deficits in extremities. Climbs on and off stretcher easily by himself. Gait steady.; Skin: Color normal, Warm, Dry.; Psych:  Rambling, tangential. Having conversations by himself in his room. Endorses SI with plan.    ED Treatments / Results  Labs (all labs ordered are listed, but only abnormal results are displayed)   EKG None  Radiology   Procedures Procedures (including critical care time)  Medications Ordered in ED Medications - No data to display   Initial Impression / Assessment and Plan / ED Course  I have reviewed the triage vital signs and the nursing notes.  Pertinent labs & imaging results that were available during my care of the patient were reviewed by me and considered in my medical decision making (see chart for details).  MDM Reviewed: previous chart, nursing note and vitals Reviewed previous: labs Interpretation: labs   Results for orders placed or performed during the  hospital encounter of 12/22/17  Acetaminophen level  Result Value Ref Range   Acetaminophen (Tylenol), Serum <10 (L) 10 - 30 ug/mL  Comprehensive metabolic panel  Result Value Ref Range   Sodium 134 (L) 135 - 145 mmol/L   Potassium 4.2 3.5 - 5.1 mmol/L   Chloride 99 (L) 101 - 111 mmol/L   CO2 29 22 - 32 mmol/L   Glucose, Bld 116 (H) 65 - 99 mg/dL   BUN 16 6 - 20 mg/dL   Creatinine, Ser 1.04 0.61 - 1.24 mg/dL   Calcium 9.3 8.9 - 10.3 mg/dL   Total Protein 7.4 6.5 - 8.1 g/dL   Albumin 3.4 (L) 3.5 - 5.0 g/dL   AST 18 15 - 41 U/L   ALT 11 (L) 17 - 63 U/L   Alkaline Phosphatase 61 38 - 126 U/L   Total Bilirubin 0.4 0.3 - 1.2 mg/dL   GFR calc non Af Amer >60 >60 mL/min   GFR calc Af Amer >60 >60 mL/min   Anion gap 6 5 - 15  Salicylate level  Result Value Ref Range   Salicylate Lvl <8.1 2.8 - 30.0 mg/dL  Ethanol  Result Value Ref Range   Alcohol, Ethyl (B) <10 <10 mg/dL  CBC with Differential  Result Value Ref Range   WBC 5.6 4.0 - 10.5 K/uL   RBC 3.68 (L) 4.22 - 5.81 MIL/uL   Hemoglobin 12.0 (L) 13.0 - 17.0 g/dL   HCT 35.5 (L) 39.0 - 52.0 %   MCV 96.5 78.0 - 100.0 fL   MCH 32.6 26.0 - 34.0 pg   MCHC 33.8 30.0 - 36.0 g/dL   RDW 14.0 11.5 - 15.5 %   Platelets 142 (L) 150 - 400 K/uL   Neutrophils Relative % 57 %   Neutro Abs 3.2 1.7 - 7.7 K/uL   Lymphocytes Relative 29 %   Lymphs Abs 1.6 0.7 - 4.0 K/uL   Monocytes Relative 13 %   Monocytes Absolute 0.7 0.1 - 1.0 K/uL   Eosinophils Relative 1 %   Eosinophils Absolute 0.0 0.0 - 0.7 K/uL   Basophils Relative 0 %   Basophils Absolute 0.0 0.0 - 0.1 K/uL  Urine rapid drug screen (hosp performed)  Result Value Ref Range   Opiates NONE DETECTED NONE DETECTED   Cocaine NONE DETECTED NONE DETECTED   Benzodiazepines NONE DETECTED NONE DETECTED   Amphetamines NONE DETECTED NONE DETECTED   Tetrahydrocannabinol NONE DETECTED NONE DETECTED   Barbiturates NONE DETECTED NONE DETECTED    1110:  Will have TTS evaluate. Holding orders  written.     Final Clinical Impressions(s) / ED Diagnoses   Final diagnoses:  None    ED Discharge Orders    None       Francine Graven, DO 12/22/17 1517

## 2017-12-23 DIAGNOSIS — F250 Schizoaffective disorder, bipolar type: Secondary | ICD-10-CM | POA: Diagnosis not present

## 2017-12-23 NOTE — Progress Notes (Signed)
Pt continues to be recommended for inpatient treatment. CSW followed up with the following hospitals:  [facility]: declined due to behavioral acuity [facility]: pt is on wait-list  [facility]: left message requesting a return call  [facility]: left message requesting a return call [facility]: requested CSW call again tomorrow morning  CSW sent referral information to the following hospitals: [facility], [facility], [facility], [facility], [facility] and [facility].   CSW will continue to assist with placement needs.   Audree Camel, LCSW, [place] Disposition [facility] BHH/TTS [phone]

## 2017-12-23 NOTE — BH Assessment (Signed)
[facility] Assessment Progress Note  Pt reassessed today. Pt is tangential with flight of ideas. It is very difficult to understand what he is saying. He is unable to be redirected. Pt continues to be recommended for IP treatment.   Kenna Gilbert. Lovena Le, [place], [place], [facility] Counselor

## 2017-12-23 NOTE — ED Notes (Signed)
Pt went to the shower escorted by sitter

## 2017-12-24 ENCOUNTER — Encounter (HOSPITAL_COMMUNITY): Payer: Self-pay | Admitting: *Deleted

## 2017-12-24 ENCOUNTER — Inpatient Hospital Stay (HOSPITAL_COMMUNITY)
Admission: AD | Admit: 2017-12-24 | Discharge: 2018-01-05 | DRG: 885 | Disposition: A | Discharge status: Home / Self Care | Payer: Medicare Other | Source: Intra-hospital | Attending: Psychiatry | Admitting: Psychiatry

## 2017-12-24 ENCOUNTER — Other Ambulatory Visit: Payer: Self-pay

## 2017-12-24 DIAGNOSIS — R45851 Suicidal ideations: Secondary | ICD-10-CM | POA: Diagnosis present

## 2017-12-24 DIAGNOSIS — Z79899 Other long term (current) drug therapy: Secondary | ICD-10-CM | POA: Diagnosis not present

## 2017-12-24 DIAGNOSIS — K219 Gastro-esophageal reflux disease without esophagitis: Secondary | ICD-10-CM | POA: Diagnosis present

## 2017-12-24 DIAGNOSIS — Z915 Personal history of self-harm: Secondary | ICD-10-CM

## 2017-12-24 DIAGNOSIS — F419 Anxiety disorder, unspecified: Secondary | ICD-10-CM | POA: Diagnosis present

## 2017-12-24 DIAGNOSIS — G4700 Insomnia, unspecified: Secondary | ICD-10-CM | POA: Diagnosis not present

## 2017-12-24 DIAGNOSIS — R451 Restlessness and agitation: Secondary | ICD-10-CM | POA: Diagnosis not present

## 2017-12-24 DIAGNOSIS — F17210 Nicotine dependence, cigarettes, uncomplicated: Secondary | ICD-10-CM | POA: Diagnosis not present

## 2017-12-24 DIAGNOSIS — F250 Schizoaffective disorder, bipolar type: Secondary | ICD-10-CM | POA: Diagnosis not present

## 2017-12-24 DIAGNOSIS — F259 Schizoaffective disorder, unspecified: Secondary | ICD-10-CM | POA: Diagnosis not present

## 2017-12-24 MED ORDER — HALOPERIDOL LACTATE 5 MG/ML IJ SOLN: 5.0000 mg | Freq: Four times a day (QID) | INTRAMUSCULAR | Status: DC | PRN

## 2017-12-24 MED ORDER — ACETAMINOPHEN 325 MG PO TABS
650.0000 mg | ORAL_TABLET | Freq: Four times a day (QID) | ORAL | Status: DC | PRN
  Administered 2017-12-27 – 2018-01-05 (×9): 650 mg via ORAL
  Filled 2017-12-24 (×9): qty 2

## 2017-12-24 MED ORDER — OLANZAPINE 10 MG PO TABS
20.0000 mg | ORAL_TABLET | Freq: Two times a day (BID) | ORAL | Status: DC
  Administered 2017-12-24 – 2017-12-25 (×2): 20 mg via ORAL
  Filled 2017-12-24 (×7): qty 2

## 2017-12-24 MED ORDER — HYDROXYZINE HCL 25 MG PO TABS
25.0000 mg | ORAL_TABLET | Freq: Three times a day (TID) | ORAL | Status: DC | PRN
  Administered 2017-12-24 – 2018-01-03 (×5): 25 mg via ORAL
  Filled 2017-12-24 (×6): qty 1

## 2017-12-24 MED ORDER — DIPHENHYDRAMINE HCL 50 MG/ML IJ SOLN: 25.0000 mg | Freq: Four times a day (QID) | INTRAMUSCULAR | Status: DC | PRN

## 2017-12-24 MED ORDER — HALOPERIDOL 5 MG PO TABS: 5.0000 mg | ORAL_TABLET | Freq: Four times a day (QID) | ORAL | Status: DC | PRN

## 2017-12-24 MED ORDER — DIPHENHYDRAMINE HCL 25 MG PO CAPS
25.0000 mg | ORAL_CAPSULE | Freq: Four times a day (QID) | ORAL | Status: DC | PRN
Start: 2017-12-24 — End: 2018-01-05

## 2017-12-24 MED ORDER — CLONAZEPAM 0.5 MG PO TABS
0.2500 mg | ORAL_TABLET | Freq: Two times a day (BID) | ORAL | Status: DC | PRN
  Administered 2017-12-24: 0.25 mg via ORAL
  Filled 2017-12-24 (×2): qty 1

## 2017-12-24 MED ORDER — ALUM & MAG HYDROXIDE-SIMETH 200-200-20 MG/5ML PO SUSP: 30.0000 mL | ORAL | Status: DC | PRN

## 2017-12-24 MED ORDER — MAGNESIUM HYDROXIDE 400 MG/5ML PO SUSP: 30.0000 mL | Freq: Every day | ORAL | Status: DC | PRN

## 2017-12-24 MED ORDER — FAMOTIDINE 20 MG PO TABS
40.0000 mg | ORAL_TABLET | Freq: Every day | ORAL | Status: DC
  Administered 2017-12-25 – 2018-01-04 (×11): 40 mg via ORAL
  Filled 2017-12-24 (×5): qty 2
  Filled 2017-12-24 (×2): qty 1
  Filled 2017-12-24 (×3): qty 2
  Filled 2017-12-24: qty 1
  Filled 2017-12-24 (×4): qty 2

## 2017-12-24 MED ORDER — TRAZODONE HCL 50 MG PO TABS
50.0000 mg | ORAL_TABLET | Freq: Every evening | ORAL | Status: DC | PRN
  Administered 2017-12-24 – 2017-12-26 (×2): 50 mg via ORAL
  Filled 2017-12-24 (×3): qty 1

## 2017-12-24 MED ORDER — BENZTROPINE MESYLATE 0.5 MG PO TABS
0.5000 mg | ORAL_TABLET | Freq: Two times a day (BID) | ORAL | Status: DC
  Administered 2017-12-24 – 2017-12-25 (×2): 0.5 mg via ORAL
  Filled 2017-12-24 (×7): qty 1

## 2017-12-24 MED ORDER — DIVALPROEX SODIUM ER 500 MG PO TB24
500.0000 mg | ORAL_TABLET | Freq: Three times a day (TID) | ORAL | Status: DC
  Administered 2017-12-24 – 2017-12-27 (×8): 500 mg via ORAL
  Filled 2017-12-24 (×14): qty 1

## 2017-12-24 MED ORDER — LORAZEPAM 1 MG PO TABS
1.0000 mg | ORAL_TABLET | Freq: Four times a day (QID) | ORAL | Status: DC | PRN
  Administered 2017-12-25 – 2017-12-29 (×3): 1 mg via ORAL
  Filled 2017-12-24 (×3): qty 1

## 2017-12-24 MED ORDER — LORAZEPAM 2 MG/ML IJ SOLN: 1.0000 mg | Freq: Four times a day (QID) | INTRAMUSCULAR | Status: DC | PRN

## 2017-12-24 NOTE — Progress Notes (Signed)
Patient ID: Frederick Fletcher, male   DOB: 06-07-1961, 57 y.o.   MRN: [id number]  Per State regulations 482.30 this chart was reviewed for medical necessity with respect to the patient's admission/duration of stay.    Next review date: 12/28/17  Debarah Crape, BSN, RN-BC  Case Manager

## 2017-12-24 NOTE — Progress Notes (Signed)
Pt accepted to [facility], Bed 500-1  Frederick Rankin, NP is the accepting provider.  Frederick Fletcher is the attending provider.  Call report to [phone]  [name] notified.   Pt is Voluntary.  Pt may be transported by [facility]  Pt scheduled  to arrive at [facility] as soon as transport can be arranged.  [name]. Judi Cong, MSW, [place] Disposition Clinical Social Work [phone] (cell) [phone] (office)

## 2017-12-24 NOTE — ED Notes (Signed)
Ate 100% of breakfast

## 2017-12-24 NOTE — BH Assessment (Signed)
Made request for telepsych set up. No answer at this time. Will keep trying

## 2017-12-24 NOTE — Progress Notes (Signed)
Adult Psychoeducational Group Note  Date:  12/24/2017 Time:  8:32 PM  Group Topic/Focus:  Wrap-Up Group:   The focus of this group is to help patients review their daily goal of treatment and discuss progress on daily workbooks.  Participation Level:  Active  Participation Quality:  Appropriate  Affect:  Appropriate  Cognitive:  Appropriate  Insight: Appropriate  Engagement in Group:  Engaged  Modes of Intervention:  Discussion  Additional Comments:  The patient expressed that he rates today a 8.The patient  also said that he did not attend group.  Nash Shearer 12/24/2017, 8:32 PM

## 2017-12-24 NOTE — Tx Team (Signed)
Initial Treatment Plan 12/24/2017 7:12 PM [name] MCR:[id number]    PATIENT STRESSORS: Medication change or noncompliance   PATIENT STRENGTHS: Ability for insight Average or above average intelligence General fund of knowledge   PATIENT IDENTIFIED PROBLEMS: Depression Auditory hallucinations Suicidal/Homicidal thoughts "Get me my medications and my food"                     DISCHARGE CRITERIA:  Ability to meet basic life and health needs Improved stabilization in mood, thinking, and/or behavior Reduction of life-threatening or endangering symptoms to within safe limits Verbal commitment to aftercare and medication compliance  PRELIMINARY DISCHARGE PLAN: Attend aftercare/continuing care group  PATIENT/FAMILY INVOLVEMENT: This treatment plan has been presented to and reviewed with the patient, Frederick Fletcher, and/or family member, .  The patient and family have been given the opportunity to ask questions and make suggestions.  [place], [place], [place] 12/24/2017, 7:12 PM

## 2017-12-24 NOTE — Progress Notes (Signed)
Frederick Fletcher is a 57 year old male pt admitted on voluntary basis from [facility]. On admission, he presents with tangential thought process and is animated in his presentation. He does report that he has been feeling suicidal and homicidal but reports that he does not have a plan and did not specify a particular person who he is homicidal towards. He reports that he has been taking all his medications as prescribed and reports that he lives in a group home in [place] and thinks that the name of the home is [facility]. He denies any current substance abuse issues and reports that he has not used anything in the past 14 months. He reports that he used to drink and smoke marijuana. He does endorse auditory hallucinations and does appear to be responding internally on admission. He reports he is unsure where he will go once discharged and spoke about how he will go wherever we send him. Frederick Fletcher was oriented to the unit and safety maintained.

## 2017-12-24 NOTE — ED Notes (Signed)
Shower and personal care provided.

## 2017-12-24 NOTE — ED Notes (Signed)
Linen changed

## 2017-12-25 DIAGNOSIS — F419 Anxiety disorder, unspecified: Secondary | ICD-10-CM

## 2017-12-25 DIAGNOSIS — G4700 Insomnia, unspecified: Secondary | ICD-10-CM

## 2017-12-25 DIAGNOSIS — F259 Schizoaffective disorder, unspecified: Secondary | ICD-10-CM

## 2017-12-25 DIAGNOSIS — F17210 Nicotine dependence, cigarettes, uncomplicated: Secondary | ICD-10-CM

## 2017-12-25 LAB — LIPID PANEL
Cholesterol: 146 mg/dL (ref 0–200)
HDL: 50 mg/dL (ref >40)
LDL Cholesterol: 83 mg/dL (ref 0–99)
Total CHOL/HDL Ratio: 2.9 RATIO
Triglycerides: 64 mg/dL (ref <150)
VLDL: 13 mg/dL (ref 0–40)

## 2017-12-25 LAB — VALPROIC ACID LEVEL: VALPROIC ACID LVL: 76 ug/mL (ref 50.0–100.0)

## 2017-12-25 LAB — TSH: TSH: 4.091 u[IU]/mL (ref 0.350–4.500)

## 2017-12-25 LAB — HEMOGLOBIN A1C
HEMOGLOBIN A1C: 6.2 % — AB (ref 4.8–5.6)
MEAN PLASMA GLUCOSE: 131.24 mg/dL

## 2017-12-25 MED ORDER — OLANZAPINE 7.5 MG PO TABS
15.0000 mg | ORAL_TABLET | Freq: Every day | ORAL | Status: DC
  Administered 2017-12-26: 15 mg via ORAL
  Filled 2017-12-25 (×2): qty 2

## 2017-12-25 MED ORDER — NICOTINE POLACRILEX 2 MG MT GUM: 2.0000 mg | CHEWING_GUM | OROMUCOSAL | Status: DC | PRN

## 2017-12-25 NOTE — [facility] Group Notes (Signed)
[place] Group Notes:  (Nursing/MHT/Case Management/Adjunct)  Date:  12/25/2017  Time:  9:57 AM  Type of Therapy:  Psychoeducational Skills  Participation Level:  Active  Participation Quality:  Appropriate and Attentive  Affect:  Appropriate and Excited  Cognitive:  Alert, Appropriate and Disorganized  Insight:  Good  Engagement in Group:  Engaged and Supportive  Modes of Intervention:  Discussion, Education and Support  Summary of Progress/Problems: Discussed Anger Management.  Patient was receptive and attentive.  Coralyn Mark Josanna Hefel 12/25/2017, 9:57 AM

## 2017-12-25 NOTE — [facility] Suicide Risk Assessment (Addendum)
[facility] Admission Suicide Risk Assessment   Nursing information obtained from:  Patient Demographic factors:  Male, Low socioeconomic status Current Mental Status:  Suicidal ideation indicated by patient, Self-harm thoughts, Thoughts of violence towards others Loss Factors:  NA Historical Factors:  Prior suicide attempts, Family history of mental illness or substance abuse Risk Reduction Factors:  Positive coping skills or problem solving skills  Total Time spent with patient: 45 minutes Principal Problem: Schizoaffective disorder ([place]) Diagnosis:   Patient Active Problem List   Diagnosis Date Noted  . Schizoaffective disorder ([place]) [F25.9] 12/24/2017   Subjective Data:   Continued Clinical Symptoms:  Alcohol Use Disorder Identification Test Final Score (AUDIT): 0 The "Alcohol Use Disorders Identification Test", Guidelines for Use in Primary Care, Second Edition.  World Pharmacologist [facility]). Score between 0-7:  no or low risk or alcohol related problems. Score between 8-15:  moderate risk of alcohol related problems. Score between 16-19:  high risk of alcohol related problems. Score 20 or above:  warrants further diagnostic evaluation for alcohol dependence and treatment.   CLINICAL FACTORS:  Patient presents as a limited historian and most information obtained from chart  57 year old male, presented to ED on 5/29 reporting suicidal ideations, with thoughts of cutting self . His landlady had contacted police . In ED patient reported history of chronic mental illness ( Paranoid Schizophrenia), homicidal ideations,  and was noted to be thought disordered. At this time patient presents with significant tangentiality/thought disorder and is hard to follow /understand sometimes . He ruminates about a woman " Ms Arminda Resides" ( ?) taking his money, so that he could not afford cigarettes. Reports HI towards this person, but when asked to elaborate states " I am devil worshipper and have cursed  her". Currently denies suicidal ideations and minimizes depression. Currently does not endorse history of mental illness, but as above reported history of Schizophrenia in ED He denies alcohol or drug abuse . Denies medical illnesses .  As per home medication list, he was prescribed Depakote ER 500 mgrs TID, Zyprexa 20 mgrs BID, Klonopin  0.25 mgrs BID PRN for anxiety. It is unclear if he was taking his medications prior to admission, when asked states " all of them sometimes and never ".  Valproic Acid Serum level was therapeutic at 61 today.  Dx- Schizoaffective Disorder   Plan- Inpatient admission. Restarted on Zyprexa , will adjust to 10 mgrs QHS initially , and titrate gradually as tolerated  Continue Depakote ER 500 mgrs TID Continue Haldol and Ativan PRN as per agitation protocol D/C Cogentin, as patient not endorsing or exhibiting EPS and unlikely on Olanzapine, and to decrease risk of possible anticholinergic side effects.  Check HgbA1C  Check EKG to monitor QTc    Musculoskeletal: Strength & Muscle Tone: within normal limits Gait & Station: normal Patient leans: N/A  Psychiatric Specialty Exam: Physical Exam  ROS denies headache, no chest pain, no shortness of breath, no vomiting   Blood pressure 107/65, pulse 75, temperature 98 F (36.7 C), temperature source Oral, resp. rate 18, height 6' (1.829 m), weight 61.2 kg (135 lb).Body mass index is 18.31 kg/m.  General Appearance: Fairly Groomed  Eye Contact:  Good  Speech:  pressured at times   Volume:  Normal  Mood:  denies depression, states mood " great"  Affect:  reactive, laughs at times   Thought Process:  Disorganized and Descriptions of Associations: Loose  Orientation:  Other:  alert and attentive, oriented to June, 2019 and  to hospital  Thought Content:  denies hallucinations. Ruminative on persecutory theme of a woman stealing his money   Suicidal Thoughts:  No today denies suicidal ideation, contracts for  safety on unit  Homicidal Thoughts:  Yes.  without intent/plan, describes HI towards a woman he identifies as Ms Arminda Resides(?) , but states he is doing this via a curse   Memory:  recent and remote fair   Judgement:  Impaired  Insight:  Lacking  Psychomotor Activity:  no psychomotor agitation  Concentration:  Concentration: Fair and Attention Span: Fair  Recall:  [employer] of Knowledge:  Fair  Language:  Fair  Akathisia:  Negative  Handed:  Right  AIMS (if indicated):     Assets:  Desire for Improvement Resilience  ADL's:  Fair   Cognition:  Impaired,  Mild  Sleep:  Number of Hours: 5.75      COGNITIVE FEATURES THAT CONTRIBUTE TO RISK:  Closed-mindedness and Loss of executive function    SUICIDE RISK:   Moderate:  Frequent suicidal ideation with limited intensity, and duration, some specificity in terms of plans, no associated intent, good self-control, limited dysphoria/symptomatology, some risk factors present, and identifiable protective factors, including available and accessible social support.  PLAN OF CARE: Patient will be admitted to inpatient psychiatric unit for stabilization and safety. Will provide and encourage milieu participation. Provide medication management and maked adjustments as needed.  Will follow daily.    I certify that inpatient services furnished can reasonably be expected to improve the patient's condition.   Jenne Campus, MD 12/25/2017, 11:29 AM

## 2017-12-25 NOTE — Progress Notes (Signed)
Adult Psychoeducational Group Note  Date:  12/25/2017 Time:  8:46 PM  Group Topic/Focus:  Wrap-Up Group:   The focus of this group is to help patients review their daily goal of treatment and discuss progress on daily workbooks.  Participation Level:  Active  Participation Quality:  Redirectable  Affect:  Labile  Cognitive:  Disorganized  Insight: Limited  Engagement in Group:  Distracting  Modes of Intervention:  Discussion  Additional Comments:   Patient reports that he had a good day because he got to play basketball and rated his day a 10. Patient displayed disorganized cognition and often interrupted group, but was able to be re-directed at times.  Doyle Askew 12/25/2017, 8:46 PM

## 2017-12-25 NOTE — Progress Notes (Signed)
Writer observed patient sitting in the dayroom quietly watching tv. Writer waved at him from the nursing station and he waved back. Writer went in to talk with him and he immediately started cussing talking about wanting to punch someone but Probation officer could not make out who he was talking about. He then started c/o his chest burning. Writer gave his pepcid early to relieve possible acid reflux. He still remains tangential in conversation with disorganized thought process. He is pleasant and redirectable when getting loud in conversation. Support given and safety maintained on unit with 15 min checks.

## 2017-12-25 NOTE — [facility] Group Notes (Signed)
  [facility] LCSW Group Therapy Note  Date/Time:  12/25/2017 11:15AM-12:00PM  Type of Therapy and Topic:  Group Therapy:  Feelings About Hospitalization  Participation Level:  Active   Description of Group This process group involved patients discussing their feelings related to being hospitalized, as well as the benefits they see to being in the hospital.  These feelings and benefits were itemized.  The group then brainstormed specific ways in which they could seek those same benefits when they discharge and return home.  Therapeutic Goals 1. Patient will identify and describe positive and negative feelings related to hospitalization 2. Patient will verbalize benefits of hospitalization to themselves personally 3. Patients will brainstorm together ways they can obtain similar benefits in the outpatient setting, identify barriers to wellness and possible solutions  Summary of Patient Progress:  The patient expressed his primary feelings about being hospitalized are that he likes it and is learning how to stay on his medications.  Throughout group he did not stop talking loudly about a variety of unrelated topics and was very difficult to distract.  He made some comments that seemed based in reality but then would follow that by obviously delusional statements such as "I've been married 18 times."  Group was terminated early because it could not really function appropriately with his constant talking.  Therapeutic Modalities Cognitive Behavioral Therapy Motivational Interviewing    Selmer Dominion, LCSW 12/25/2017, 12:50 PM

## 2017-12-25 NOTE — [facility] Counselor (Signed)
Clinical Social Work Note  Patient was observed during group to be disorganized and delusional, unable to share information in a logical fashion at this time.  Therefore, Psychosocial Assessment will not be attempted today.  Selmer Dominion, LCSW 12/25/2017, 1:20 PM

## 2017-12-25 NOTE — Plan of Care (Signed)
  Problem: Education: Goal: Emotional status will improve Outcome: Progressing   Problem: Activity: Goal: Interest or engagement in activities will improve Outcome: Progressing   Problem: Safety: Goal: Periods of time without injury will increase Outcome: Progressing   Problem: Medication: Goal: Compliance with prescribed medication regimen will improve Outcome: Progressing  DAR NOTE: Patient appears disorganized with tangential thought process.  Denies suicidal thoughts, auditory and visual hallucinations.  Described energy level as hyper and concentration as good.  Rates depression at 0, hopelessness at 0, and anxiety at 0.  Maintained on routine safety checks.  Medications given as prescribed.  Support and encouragement offered as needed.  Attended group and participated.  Patient observed socializing with peers in the dayroom.  Offered no complaint.

## 2017-12-25 NOTE — H&P (Addendum)
Psychiatric Admission Assessment Adult  Patient Identification: Frederick Fletcher MRN:  [id number] Date of Evaluation:  12/25/2017 Chief Complaint:  [place]; BIPOLAR TYPE Principal Diagnosis: Schizoaffective disorder ([place]) Diagnosis:   Patient Active Problem List   Diagnosis Date Noted  . Schizoaffective disorder ([place]) [F25.9] 12/24/2017   History of Present Illness:  Per assessment note:Frederick Fletcher is a single 57 y.o. male who presents to [facility] stating he is having SI with plan to cut himself. He reports he is in the hospital because "I am paranoid schizophrenic & my mind shuts down on me sometimes."  He speaks quickly in long sentences that are difficult to understand.  Part of a sentence may make sense, but then derail or the opposite- nonsense will form into a logical expression of thought.  Initially, pt was talking about mennonites with little input from this Probation officer. He is difficult to redirect at times, but will eventually catch on to what is being asked to him, and respond, even if briefly. Pt reports proudly he does not use drugs anymore. It's not very clear, but seemed like pt was reporting occasional alcohol use. Pt also states he has too much blood in the back of his head. He reports previous suicide attempts "so many times" & an attempt in [id number] when he sliced open his arm with a steak knife. Pt reports he see Dr. Renato Battles q 2 weeks for meds. He states "Clozaril helps". Pt reports HI toward 2 women- [name], who may be landlord. Pt states he cannot return there, does not want to return there. Pt states she is mean as hell and is scared of me bc I threw a chair at her. Other woman may be a friend of pt's sister, "[name]". Pt states he will kill her. Both situations seems   On evaluation: Patient was evaluated and NP and MD. Patient present tangential and disorganized during this assessment. Patient present with pressured speech. Patient is alert and oriented to  self and place. Patient is a poor historian. Reports he is prescribed " Zyprexa and prolactin or something like that" Patient denies suicidal or homicidal ideations during this assessment. Patient is requesting a cigarette, however is refusing nicotine patch.    Denies previous suicidal attempts. Support, encouragement and reassurance was provided.   Associated Signs/Symptoms: Depression Symptoms:  difficulty concentrating, impaired memory, disturbed sleep, (Hypo) Manic Symptoms:  Distractibility, Flight of Ideas, Impulsivity, Irritable Mood, Labiality of Mood, Anxiety Symptoms:  Excessive Worry, Psychotic Symptoms:  Hallucinations: Auditory PTSD Symptoms: NA Total Time spent with patient: 20 minutes  Past Psychiatric History:   Is the patient at risk to self? Yes.    Has the patient been a risk to self in the past 6 months? Yes.    Has the patient been a risk to self within the distant past? Yes.    Is the patient a risk to others? No.  Has the patient been a risk to others in the past 6 months? No.  Has the patient been a risk to others within the distant past? No.   Prior Inpatient Therapy:   Prior Outpatient Therapy:    Alcohol Screening: 1. How often do you have a drink containing alcohol?: Never 2. How many drinks containing alcohol do you have on a typical day when you are drinking?: 1 or 2 3. How often do you have six or more drinks on one occasion?: Never AUDIT-C Score: 0 4. How often during the last year have you found that you  were not able to stop drinking once you had started?: Never 5. How often during the last year have you failed to do what was normally expected from you becasue of drinking?: Never 6. How often during the last year have you needed a first drink in the morning to get yourself going after a heavy drinking session?: Never 7. How often during the last year have you had a feeling of guilt of remorse after drinking?: Never 8. How often during the last  year have you been unable to remember what happened the night before because you had been drinking?: Never 9. Have you or someone else been injured as a result of your drinking?: No 10. Has a relative or friend or a doctor or another health worker been concerned about your drinking or suggested you cut down?: No Alcohol Use Disorder Identification Test Final Score (AUDIT): 0 Intervention/Follow-up: AUDIT Score <7 follow-up not indicated Substance Abuse History in the last 12 months:  No.- UDS neg Consequences of Substance Abuse: NA Previous Psychotropic Medications: Yes  Psychological Evaluations: Yes  Past Medical History: History reviewed. No pertinent past medical history. History reviewed. No pertinent surgical history. Family History: History reviewed. No pertinent family history. Family Psychiatric  History:  Tobacco Screening: Have you used any form of tobacco in the last 30 days? (Cigarettes, Smokeless Tobacco, Cigars, and/or Pipes): Yes Tobacco use, Select all that apply: 5 or more cigarettes per day Are you interested in Tobacco Cessation Medications?: No, patient refused Counseled patient on smoking cessation including recognizing danger situations, developing coping skills and basic information about quitting provided: Refused/Declined practical counseling Social History:  Social History   Substance and Sexual Activity  Alcohol Use Not Currently  . Frequency: Never     Social History   Substance and Sexual Activity  Drug Use Not Currently    Additional Social History:                           Allergies:  No Known Allergies Lab Results:  Results for orders placed or performed during the hospital encounter of 12/24/17 (from the past 48 hour(s))  Valproic acid level     Status: None   Collection Time: 12/25/17  6:23 AM  Result Value Ref Range   Valproic Acid Lvl 76 50.0 - 100.0 ug/mL    Comment: Performed at [facility], [place] [address], [place], [place] [address]  TSH     Status: None   Collection Time: 12/25/17  6:23 AM  Result Value Ref Range   TSH 4.091 0.350 - 4.500 uIU/mL    Comment: Performed by a 3rd Generation assay with a functional sensitivity of <=0.01 uIU/mL. Performed at [facility], [place] [address] [place], [place] [address]   Lipid panel     Status: None   Collection Time: 12/25/17  6:23 AM  Result Value Ref Range   Cholesterol 146 0 - 200 mg/dL   Triglycerides 64 <150 mg/dL   HDL 50 >40 mg/dL   Total CHOL/HDL Ratio 2.9 RATIO   VLDL 13 0 - 40 mg/dL   LDL Cholesterol 83 0 - 99 mg/dL    Comment:        Total Cholesterol/HDL:CHD Risk Coronary Heart Disease Risk Table                     Men   Women  1/2 Average Risk   3.4  3.3  Average Risk       5.0   4.4  2 X Average Risk   9.6   7.1  3 X Average Risk  23.4   11.0        Use the calculated Patient Ratio above and the CHD Risk Table to determine the patient's CHD Risk.        ATP III CLASSIFICATION (LDL):  <100     mg/dL   Optimal  100-129  mg/dL   Near or Above                    Optimal  130-159  mg/dL   Borderline  160-189  mg/dL   High  >190     mg/dL   Very High Performed at [facility] [address], [place], [place] [address]     Blood Alcohol level:  Lab Results  Component Value Date   ETH <10 51/88/4166    Metabolic Disorder Labs:  Lab Results  Component Value Date   HGBA1C  01/20/2008    6.0 (NOTE)   The ADA recommends the following therapeutic goal for glycemic   control related to Hgb A1C measurement:   Goal of Therapy:   < 7.0% Hgb A1C   Reference: American Diabetes Association: Clinical Practice   Recommendations 2008, Diabetes Care,  2008, 31:(Suppl 1).   MPG 136 01/20/2008   No results found for: PROLACTIN Lab Results  Component Value Date   CHOL 146 12/25/2017   TRIG 64 12/25/2017   HDL 50 12/25/2017   CHOLHDL 2.9 12/25/2017   VLDL 13 12/25/2017   LDLCALC 83  12/25/2017   LDLCALC  01/20/2008    68        Total Cholesterol/HDL:CHD Risk Coronary Heart Disease Risk Table                     Men   Women  1/2 Average Risk   3.4   3.3    Current Medications: Current Facility-Administered Medications  Medication Dose Route Frequency Provider Last Rate Last Dose  . acetaminophen (TYLENOL) tablet 650 mg  650 mg Oral Q6H PRN Money, Lowry Ram, FNP      . alum & mag hydroxide-simeth (MAALOX/MYLANTA) 200-200-20 MG/5ML suspension 30 mL  30 mL Oral Q4H PRN Money, Darnelle Maffucci B, FNP      . benztropine (COGENTIN) tablet 0.5 mg  0.5 mg Oral BID Money, Darnelle Maffucci B, FNP   0.5 mg at 12/25/17 0814  . clonazePAM (KLONOPIN) tablet 0.25 mg  0.25 mg Oral BID PRN Money, Lowry Ram, FNP   0.25 mg at 12/24/17 2004  . diphenhydrAMINE (BENADRYL) capsule 25 mg  25 mg Oral Q6H PRN Money, Lowry Ram, FNP       Or  . diphenhydrAMINE (BENADRYL) injection 25 mg  25 mg Intramuscular Q6H PRN Money, Darnelle Maffucci B, FNP      . divalproex (DEPAKOTE ER) 24 hr tablet 500 mg  500 mg Oral TID Money, Lowry Ram, FNP   500 mg at 12/25/17 0813  . famotidine (PEPCID) tablet 40 mg  40 mg Oral QHS Money, Travis B, FNP      . haloperidol (HALDOL) tablet 5 mg  5 mg Oral Q6H PRN Money, Darnelle Maffucci B, FNP       Or  . haloperidol lactate (HALDOL) injection 5 mg  5 mg Intramuscular Q6H PRN Money, Lowry Ram, FNP      . hydrOXYzine (ATARAX/VISTARIL) tablet 25 mg  25 mg Oral TID PRN Money, Lowry Ram, FNP   25 mg at 12/24/17 2056  . LORazepam (ATIVAN) tablet 1 mg  1 mg Oral Q6H PRN Money, Lowry Ram, FNP       Or  . LORazepam (ATIVAN) injection 1 mg  1 mg Intramuscular Q6H PRN Money, Darnelle Maffucci B, FNP      . magnesium hydroxide (MILK OF MAGNESIA) suspension 30 mL  30 mL Oral Daily PRN Money, Darnelle Maffucci B, FNP      . nicotine polacrilex (NICORETTE) gum 2 mg  2 mg Oral PRN Derrill Center, NP      . OLANZapine (ZYPREXA) tablet 20 mg  20 mg Oral BID Money, Lowry Ram, FNP   20 mg at 12/25/17 0813  . traZODone (DESYREL) tablet 50 mg  50 mg Oral  QHS PRN Money, Lowry Ram, FNP   50 mg at 12/24/17 2056   PTA Medications: Medications Prior to Admission  Medication Sig Dispense Refill Last Dose  . benztropine (COGENTIN) 0.5 MG tablet Take 0.5 mg by mouth 2 (two) times daily.   Past Week at Unknown time  . clonazePAM (KLONOPIN) 0.5 MG tablet Take 0.25 mg by mouth 2 (two) times daily as needed for anxiety.   Past Week at Unknown time  . divalproex (DEPAKOTE ER) 500 MG 24 hr tablet Take 500 mg by mouth 3 (three) times daily.   Past Week at Unknown time  . OLANZapine (ZYPREXA) 20 MG tablet Take 20 mg by mouth 2 (two) times daily.   Past Week at Unknown time  . ranitidine (ZANTAC) 300 MG tablet Take 300 mg by mouth at bedtime as needed for heartburn.   Past Week at Unknown time    Musculoskeletal: Strength & Muscle Tone: within normal limits Gait & Station: normal Patient leans: N/A  Psychiatric Specialty Exam: Physical Exam  Nursing note and vitals reviewed. Constitutional: He appears well-developed.  Cardiovascular: Normal rate.  Neurological: He is alert.  Psychiatric: He has a normal mood and affect. His behavior is normal.    Review of Systems  Psychiatric/Behavioral: Positive for hallucinations. The patient is nervous/anxious.   All other systems reviewed and are negative.   Blood pressure 107/65, pulse 75, temperature 98 F (36.7 C), temperature source Oral, resp. rate 18, height 6' (1.829 m), weight 61.2 kg (135 lb).Body mass index is 18.31 kg/m.  General Appearance: Bizarre and Disheveled  Eye Contact:  Fair  Speech:  Garbled and Pressured  Volume:  Increased  Mood:  Anxious  Affect:  Labile  Thought Process:  Disorganized and Descriptions of Associations: Loose  Orientation:  Other:  Self and place  Thought Content:  Hallucinations: Auditory and Ideas of Reference:   Paranoia  Suicidal Thoughts:  No  Homicidal Thoughts:  No  Memory:  Immediate;   Fair Recent;   Fair Remote;   Fair  Judgement:  Impaired  Insight:   Lacking  Psychomotor Activity:  Restlessness  Concentration:  Concentration: Poor  Recall:  [employer] of Knowledge:  Fair  Language:  Fair  Akathisia:  No  Handed:  Right  AIMS (if indicated):     Assets:  Communication Skills Desire for Improvement Resilience Social Support  ADL's:  Intact  Cognition:  WNL  Sleep:  Number of Hours: 5.75    Treatment Plan Summary: Daily contact with patient to assess and evaluate symptoms and progress in treatment and Medication management   Continue with Depakote 500 mg, Klonopin 0.66m PRN BID, Zyprexa 20 mg  and Cogentin 05.39m for mood stabilization.  Continue with Trazodone 50 mg for insomnia  Will continue to monitor vitals ,medication compliance and treatment side effects while patient is here.  Reviewed labs: BAL - , UDS -   CSW will start working on disposition.  Patient to participate in therapeutic milieu  Observation Level/Precautions:  15 minute checks  Laboratory:  CBC Chemistry Profile HbAIC UDS  Psychotherapy:  Individual and group session   Medications:  See SRA   Consultations:  CSW and Psychiatrist   Discharge Concerns:  Safety, stabilization, and risk of access to medication and medication stabilization   Estimated LOS: 5-7days  Other:     Physician Treatment Plan for Primary Diagnosis: Schizoaffective disorder ([place] Long Term Goal(s): Improvement in symptoms so as ready for discharge  Short Term Goals: Ability to identify changes in lifestyle to reduce recurrence of condition will improve, Ability to verbalize feelings will improve, Ability to demonstrate self-control will improve and Compliance with prescribed medications will improve  Physician Treatment Plan for Secondary Diagnosis: Principal Problem:   Schizoaffective disorder ([place]  Long Term Goal(s): Improvement in symptoms so as ready for discharge  Short Term Goals: Ability to identify changes in lifestyle to reduce recurrence of condition will  improve, Ability to identify and develop effective coping behaviors will improve and Compliance with prescribed medications will improve  I certify that inpatient services furnished can reasonably be expected to improve the patient's condition.    TDerrill Center NP 6/1/201910:24 AM   I have discussed case with NP and have met with patient  Agree with NP note and assessment  Patient presents as a limited historian and most information obtained from chart  57year old male, presented to ED on 5/29 reporting suicidal ideations, with thoughts of cutting self . His landlady had contacted police . In ED patient reported history of chronic mental illness ( Paranoid Schizophrenia), homicidal ideations,  and was noted to be thought disordered. At this time patient presents with significant tangentiality/thought disorder and is hard to follow /understand sometimes . He ruminates about a woman " Ms RArminda Resides ( ?) taking his money, so that he could not afford cigarettes. Reports HI towards this person, but when asked to elaborate states " I am devil worshipper and have cursed her". Currently denies suicidal ideations and minimizes depression. Currently does not endorse history of mental illness, but as above reported history of Schizophrenia in ED He denies alcohol or drug abuse . Denies medical illnesses .  As per home medication list, he was prescribed Depakote ER 500 mgrs TID, Zyprexa 20 mgrs BID, Klonopin  0.25 mgrs BID PRN for anxiety. It is unclear if he was taking his medications prior to admission, when asked states " all of them sometimes and never ".  Valproic Acid Serum level was therapeutic at 61 today.  Dx- Schizoaffective Disorder   Plan- Inpatient admission. Restarted on Zyprexa , will adjust to 10 mgrs QHS initially , and titrate gradually as tolerated  Continue Depakote ER 500 mgrs TID Continue Haldol and Ativan PRN as per agitation protocol D/C Cogentin, as patient not endorsing or  exhibiting EPS and unlikely on Olanzapine, and to decrease risk of possible anticholinergic side effects.  Check HgbA1C  Check EKG to monitor QTc

## 2017-12-25 NOTE — Progress Notes (Signed)
Writer has observed patient up in the dayroom laughing loudly an d inappropriately. He has been having conversations with himself and even when Probation officer spoke with him 1:1 his conversation was tangential with flight of ideas. Writer had a difficult time trying to understand him. Writer gave him a tray at beginning of shift because he reported being hungry. He was compliant with his medications. Safety maintained on unit with 15 min checks.

## 2017-12-26 DIAGNOSIS — Z79899 Other long term (current) drug therapy: Secondary | ICD-10-CM

## 2017-12-26 LAB — HEMOGLOBIN A1C
HEMOGLOBIN A1C: 6.5 % — AB (ref 4.8–5.6)
MEAN PLASMA GLUCOSE: 139.85 mg/dL

## 2017-12-26 LAB — PROLACTIN: PROLACTIN: 50.5 ng/mL — AB (ref 4.0–15.2)

## 2017-12-26 NOTE — Progress Notes (Signed)
Writer spoke with patient 1:1 and he reports having had a good day and complimented the staff on taking good care of him. Frederick Fletcher is still disorganized in his conversation but has a lot of knowledge about his life and the group home he was once in. He is hoping to find another group home to live in and he reports that he know to take his medications and no drinking or drugs. He appears to be calming down a little each night and not as restless as when he first was admitted. Support given and safety maintained on unit with 15 min checks.

## 2017-12-26 NOTE — Progress Notes (Signed)
D: Patient presents tangential, disorganized, responding to internal stimuli. Patient was singing loudly in shower this morning, and disrupting other's sleep. He was not redirectable verbally, but his attitude was pleasant this morning. He made several nonsensical statements, likely loose associations about the writer having "a motorcycle" and "needing meds for his body." Patient has mildly elevated A1c at 6.2, provider notified. Patient is orthostatic, but denies symptoms of lightheadedness, dizziness upon standing. Reports his sleep was "good" last night, and patient reports trazodone was helpful. Per patient survey, mood is good, hopelessness a 2/10, and anxiety is "none." Patient denies symptoms of withdrawal. Appetite is good, energy normal and concentration good.  Patient denies SI/HI/AVH.  A: Patient checked q15 min, and checks reviewed. Reviewed medication changes with patient and educated on side effects. Educated patient on importance of attending group therapy sessions and educated on several coping skills. Encouarged participation in milieu through recreation therapy and attending meals with peers.  R: Patient receptive to education on medications, and is medication compliant. Patient attending all group therapy sessions and cafeteria with peers. Patient contracts for safety on the unit. Goal for today "learn to believe."

## 2017-12-26 NOTE — [facility] Group Notes (Signed)
[facility] Group Notes:  (Nursing/MHT/Case Management/Adjunct)  Date:  12/26/2017  Time:  4:27 PM  Type of Therapy:  Psychoeducational Skills  Participation Level:  Active  Participation Quality:  Monopolizing  Affect:  Defensive and Not Congruent  Cognitive:  Alert and Confused  Insight:  Limited  Engagement in Group:  Distracting and Monopolizing  Modes of Intervention:  Discussion and Education  Summary of Progress/Problems: Group was on good sleep hygiene and habits. This included, setting a routine, cutting out caffeine, alcohol and nicotine, creating a comfortable environment, and only going to bed when tired.   Lesli Albee 12/26/2017, 4:27 PM

## 2017-12-26 NOTE — ED Notes (Signed)
Pt had a bag of medication in ed, medications counted, Frederick Fletcher at [facility] notified of belongings in ed, advised that she would tell the pt, medications given to [facility] to take to pharmacy,

## 2017-12-26 NOTE — Plan of Care (Signed)
Patient continues to make bizarre statements, and display disorganized speech. He appears to be responding to internal stimuli, but denies AVH. Patient is pleasant, but difficult to verbally redirect. He is attending group therapy sessions.

## 2017-12-26 NOTE — [facility] Group Notes (Signed)
[facility] LCSW Group Therapy Note  Date/Time:  12/26/2017  11:00AM-12:00PM  Type of Therapy and Topic:  Group Therapy:  Music and Mood  Participation Level:  Active   Description of Group: In this process group, members listened to a variety of genres of music and identified that different types of music evoke different responses.  Patients were encouraged to identify music that was soothing for them and music that was energizing for them.  Patients discussed how this knowledge can help with wellness and recovery in various ways including managing depression and anxiety as well as encouraging healthy sleep habits.    Therapeutic Goals: 1. Patients will explore the impact of different varieties of music on mood 2. Patients will verbalize the thoughts they have when listening to different types of music 3. Patients will identify music that is soothing to them as well as music that is energizing to them 4. Patients will discuss how to use this knowledge to assist in maintaining wellness and recovery 5. Patients will explore the use of music as a coping skill  Summary of Patient Progress:  At the beginning of group, patient expressed that his current mood was "like drinking whiskey and a whistle."  He frequently talked loudly over the music throughout group and was redirectable only for a few seconds at a time.  He did dance and laugh at very times throughout group.  When asked numerous times throughout group, as was each group member, how different songs made him feel, his responses were incomprehensible.  At the end of group he was asked how he felt different compared to the beginning of group, and he responded, "well it's a long way to think but a whole way not to stop, ears of corn would be so good, and it could bust your brains out."  Therapeutic Modalities: Solution Focused Brief Therapy Activity   Selmer Dominion, LCSW

## 2017-12-26 NOTE — Progress Notes (Addendum)
[facility] MD Progress Note  12/26/2017 2:44 PM Frederick Fletcher  MRN:  [id number] Subjective:  Javar seen sitting in day room interacting with peers and staff.  Patient presents disorganized but pleasant.  patient is hyperverbal, tangential and incoherent with statements, however is redirectable.  patient's alert and oriented to self, place, and current events.  Patient reports he resides at a group home.  Denies suicidal or homicidal ideations during this assessment.  Reports taking medications as prescribed and tolerating them well.  Patient seen attending group sessions with active and engaged participation.  Support encouragement reassurance was provided.  History: Per assessment note:Frederick R Beddingfieldis a single56 y.o.malewho presents to [facility] stating he is having SI with plan to cut himself. He reports he is in the hospital because "I am paranoid schizophrenic &my mind shuts down on me sometimes." He speaks quickly in long sentences that are difficult to understand. Part of a sentence may make sense, but then derail or the opposite- nonsense will form into a logical expression of thought. Initially, pt was talking about mennonites with little input from this Probation officer. He is difficult to redirect at times, but will eventually catch on to what is being asked to him, and respond, even if briefly. Pt reports proudly he does not use drugs anymore. It's not very clear, but seemed like pt was reporting occasional alcohol use.     Principal Problem: Schizoaffective disorder ([place]) Diagnosis:   Patient Active Problem List   Diagnosis Date Noted  . Schizoaffective disorder ([place]) [F25.9] 12/24/2017   Total Time spent with patient: 15 minutes  Past Psychiatric History:   Past Medical History: History reviewed. No pertinent past medical history. History reviewed. No pertinent surgical history. Family History: History reviewed. No pertinent family history. Family Psychiatric  History:  Social  History:  Social History   Substance and Sexual Activity  Alcohol Use Not Currently  . Frequency: Never     Social History   Substance and Sexual Activity  Drug Use Not Currently    Social History   Socioeconomic History  . Marital status: Single    Spouse name: Not on file  . Number of children: Not on file  . Years of education: Not on file  . Highest education level: Not on file  Occupational History  . Not on file  Social Needs  . Financial resource strain: Not on file  . Food insecurity:    Worry: Not on file    Inability: Not on file  . Transportation needs:    Medical: Not on file    Non-medical: Not on file  Tobacco Use  . Smoking status: Current Every Day Smoker    Packs/day: 1.00    Types: Cigarettes  . Smokeless tobacco: Never Used  Substance and Sexual Activity  . Alcohol use: Not Currently    Frequency: Never  . Drug use: Not Currently  . Sexual activity: Not Currently  Lifestyle  . Physical activity:    Days per week: Not on file    Minutes per session: Not on file  . Stress: Not on file  Relationships  . Social connections:    Talks on phone: Not on file    Gets together: Not on file    Attends religious service: Not on file    Active member of club or organization: Not on file    Attends meetings of clubs or organizations: Not on file    Relationship status: Not on file  Other Topics Concern  .  Not on file  Social History Narrative  . Not on file   Additional Social History:                         Sleep: Fair  Appetite:  Poor  Current Medications: Current Facility-Administered Medications  Medication Dose Route Frequency Provider Last Rate Last Dose  . acetaminophen (TYLENOL) tablet 650 mg  650 mg Oral Q6H PRN Money, Lowry Ram, FNP      . alum & mag hydroxide-simeth (MAALOX/MYLANTA) 200-200-20 MG/5ML suspension 30 mL  30 mL Oral Q4H PRN Money, Lowry Ram, FNP      . clonazePAM (KLONOPIN) tablet 0.25 mg  0.25 mg Oral BID PRN  Money, Lowry Ram, FNP   0.25 mg at 12/24/17 2004  . diphenhydrAMINE (BENADRYL) capsule 25 mg  25 mg Oral Q6H PRN Money, Lowry Ram, FNP       Or  . diphenhydrAMINE (BENADRYL) injection 25 mg  25 mg Intramuscular Q6H PRN Money, Darnelle Maffucci B, FNP      . divalproex (DEPAKOTE ER) 24 hr tablet 500 mg  500 mg Oral TID Money, Lowry Ram, FNP   500 mg at 12/26/17 1251  . famotidine (PEPCID) tablet 40 mg  40 mg Oral QHS Money, Travis B, FNP   40 mg at 12/25/17 1944  . haloperidol (HALDOL) tablet 5 mg  5 mg Oral Q6H PRN Money, Darnelle Maffucci B, FNP       Or  . haloperidol lactate (HALDOL) injection 5 mg  5 mg Intramuscular Q6H PRN Money, Lowry Ram, FNP      . hydrOXYzine (ATARAX/VISTARIL) tablet 25 mg  25 mg Oral TID PRN Money, Lowry Ram, FNP   25 mg at 12/24/17 2056  . LORazepam (ATIVAN) tablet 1 mg  1 mg Oral Q6H PRN Money, Lowry Ram, FNP   1 mg at 12/25/17 1944   Or  . LORazepam (ATIVAN) injection 1 mg  1 mg Intramuscular Q6H PRN Money, Darnelle Maffucci B, FNP      . magnesium hydroxide (MILK OF MAGNESIA) suspension 30 mL  30 mL Oral Daily PRN Money, Darnelle Maffucci B, FNP      . nicotine polacrilex (NICORETTE) gum 2 mg  2 mg Oral PRN Derrill Center, NP      . OLANZapine (ZYPREXA) tablet 15 mg  15 mg Oral QHS Eriyana Sweeten A, MD      . traZODone (DESYREL) tablet 50 mg  50 mg Oral QHS PRN Money, Lowry Ram, FNP   50 mg at 12/24/17 2056    Lab Results:  Results for orders placed or performed during the hospital encounter of 12/24/17 (from the past 48 hour(s))  Valproic acid level     Status: None   Collection Time: 12/25/17  6:23 AM  Result Value Ref Range   Valproic Acid Lvl 76 50.0 - 100.0 ug/mL    Comment: Performed at [facility], [place] [address], [place], [place] [address]  TSH     Status: None   Collection Time: 12/25/17  6:23 AM  Result Value Ref Range   TSH 4.091 0.350 - 4.500 uIU/mL    Comment: Performed by a 3rd Generation assay with a functional sensitivity of <=0.01 uIU/mL. Performed at [facility], [place] [address] [place], [place] [address]   Prolactin     Status: Abnormal   Collection Time: 12/25/17  6:23 AM  Result Value Ref Range   Prolactin 50.5 (H) 4.0 - 15.2 ng/mL  Comment: (NOTE) Performed At: [facility] [place], [place] [id number] Rush Farmer MD [id number] Performed at [facility], [place] [address] [place], [place] [address]   Hemoglobin A1c     Status: Abnormal   Collection Time: 12/25/17  6:23 AM  Result Value Ref Range   Hgb A1c MFr Bld 6.2 (H) 4.8 - 5.6 %    Comment: (NOTE) Pre diabetes:          5.7%-6.4% Diabetes:              >6.4% Glycemic control for   <7.0% adults with diabetes    Mean Plasma Glucose 131.24 mg/dL    Comment: Performed at [facility] [address], [place], [place] [address]  Lipid panel     Status: None   Collection Time: 12/25/17  6:23 AM  Result Value Ref Range   Cholesterol 146 0 - 200 mg/dL   Triglycerides 64 <150 mg/dL   HDL 50 >40 mg/dL   Total CHOL/HDL Ratio 2.9 RATIO   VLDL 13 0 - 40 mg/dL   LDL Cholesterol 83 0 - 99 mg/dL    Comment:        Total Cholesterol/HDL:CHD Risk Coronary Heart Disease Risk Table                     Men   Women  1/2 Average Risk   3.4   3.3  Average Risk       5.0   4.4  2 X Average Risk   9.6   7.1  3 X Average Risk  23.4   11.0        Use the calculated Patient Ratio above and the CHD Risk Table to determine the patient's CHD Risk.        ATP III CLASSIFICATION (LDL):  <100     mg/dL   Optimal  100-129  mg/dL   Near or Above                    Optimal  130-159  mg/dL   Borderline  160-189  mg/dL   High  >190     mg/dL   Very High Performed at [facility] [address], [place], [place] [address]   Hemoglobin A1c     Status: Abnormal   Collection Time: 12/26/17  6:28 AM  Result Value Ref Range   Hgb A1c MFr Bld 6.5 (H) 4.8 - 5.6 %    Comment: (NOTE) Pre diabetes:           5.7%-6.4% Diabetes:              >6.4% Glycemic control for   <7.0% adults with diabetes    Mean Plasma Glucose 139.85 mg/dL    Comment: Performed at [facility] [address] [place], [place] [address]    Blood Alcohol level:  Lab Results  Component Value Date   ETH <10 89/21/1942    Metabolic Disorder Labs: Lab Results  Component Value Date   HGBA1C 6.5 (H) 12/26/2017   MPG 139.85 12/26/2017   MPG 131.24 12/25/2017   Lab Results  Component Value Date   PROLACTIN 50.5 (H) 12/25/2017   Lab Results  Component Value Date   CHOL 146 12/25/2017   TRIG 64 12/25/2017   HDL 50 12/25/2017   CHOLHDL 2.9 12/25/2017   VLDL 13 12/25/2017   LDLCALC 83 12/25/2017   LDLCALC  01/20/2008    68        Total Cholesterol/HDL:CHD Risk Coronary Heart Disease Risk Table                     Men   Women  1/2 Average Risk   3.4   3.3    Physical Findings: AIMS: Facial and Oral Movements Muscles of Facial Expression: None, normal Lips and Perioral Area: None, normal Jaw: None, normal Tongue: None, normal,Extremity Movements Upper (arms, wrists, hands, fingers): None, normal Lower (legs, knees, ankles, toes): None, normal, Trunk Movements Neck, shoulders, hips: None, normal, Overall Severity Severity of abnormal movements (highest score from questions above): None, normal Incapacitation due to abnormal movements: None, normal Patient's awareness of abnormal movements (rate only patient's report): No Awareness, Dental Status Current problems with teeth and/or dentures?: No Does patient usually wear dentures?: No  CIWA:  CIWA-Ar Total: 4 COWS:  COWS Total Score: 0  Musculoskeletal: Strength & Muscle Tone: within normal limits Gait & Station: normal Patient leans: N/A  Psychiatric Specialty Exam: Physical Exam  Nursing note and vitals reviewed. Constitutional: He appears well-developed.  Cardiovascular: Normal rate.  Psychiatric: He has a normal mood and affect. His  behavior is normal.    Review of Systems  Psychiatric/Behavioral: Positive for depression and hallucinations. Negative for suicidal ideas. The patient is nervous/anxious.   All other systems reviewed and are negative.   Blood pressure (!) 85/58, pulse 97, temperature 97.7 F (36.5 C), resp. rate 18, height 6' (1.829 m), weight 61.2 kg (135 lb).Body mass index is 18.31 kg/m.  General Appearance: Disheveled  Eye Contact:  Fair  Speech:  Garbled and Pressured  Volume:  Increased  Mood:  Anxious and Depressed  Affect:  Congruent and Labile  Thought Process:  Linear  Orientation:  Full (Time, Place, and Person)  Thought Content:  Hallucinations: Auditory  Suicidal Thoughts:  No  Homicidal Thoughts:  No  Memory:  Immediate;   Fair Recent;   Fair Remote;   Fair  Judgement:  Fair  Insight:  Fair  Psychomotor Activity:  Normal  Concentration:  Concentration: Fair  Recall:  [employer] of Knowledge:  Fair  Language:  Fair  Akathisia:  No  Handed:  Right  AIMS (if indicated):     Assets:  Communication Skills Desire for Improvement Resilience Social Support  ADL's:  Intact  Cognition:  WNL  Sleep:  Number of Hours: 6.75     Treatment Plan Summary: Daily contact with patient to assess and evaluate symptoms and progress in treatment and Medication management   Continue with current treatment plan on 12/26/2017 as listed below except where noted   Mood stabilization:   Continue with Depakote 500 mg,  ContinueKlonopin 0.5mg  PRN BID  Continue Zyprexa 20 mg   Continue Cogentin 05.0mg  for mood stabilization.  insomnia  Continue with Trazodone 50 mg   Will continue to monitor vitals ,medication compliance and treatment side effects while patient is here.  Reviewed labs: BAL - , UDS -   CSW will start working on disposition.  Patient to participate in therapeutic milieu   Derrill Center, NP 12/26/2017, 2:44 PM   ..Agree with NP Progress Note

## 2017-12-26 NOTE — [facility] Counselor (Signed)
Clinical Social Work Note  Psychosocial Assessment cannot be attempted today due to patient's continued disorganization of thoughts.  CSW will continue attempts as his thinking clears.  Selmer Dominion, LCSW 12/26/2017, 2:01 PM

## 2017-12-27 DIAGNOSIS — F250 Schizoaffective disorder, bipolar type: Principal | ICD-10-CM

## 2017-12-27 DIAGNOSIS — R451 Restlessness and agitation: Secondary | ICD-10-CM

## 2017-12-27 MED ORDER — TRAZODONE HCL 50 MG PO TABS
50.0000 mg | ORAL_TABLET | Freq: Every evening | ORAL | Status: DC | PRN
  Administered 2017-12-27 – 2018-01-04 (×12): 50 mg via ORAL
  Filled 2017-12-27 (×12): qty 1

## 2017-12-27 MED ORDER — DIVALPROEX SODIUM 500 MG PO DR TAB
500.0000 mg | DELAYED_RELEASE_TABLET | ORAL | Status: DC
  Administered 2017-12-28 – 2018-01-05 (×9): 500 mg via ORAL
  Filled 2017-12-27 (×13): qty 1

## 2017-12-27 MED ORDER — OLANZAPINE 10 MG PO TABS
20.0000 mg | ORAL_TABLET | Freq: Every day | ORAL | Status: DC
  Administered 2017-12-27 – 2018-01-04 (×9): 20 mg via ORAL
  Filled 2017-12-27 (×11): qty 2

## 2017-12-27 MED ORDER — DIVALPROEX SODIUM 500 MG PO DR TAB
1000.0000 mg | DELAYED_RELEASE_TABLET | Freq: Every day | ORAL | Status: DC
  Administered 2017-12-27 – 2018-01-04 (×9): 1000 mg via ORAL
  Filled 2017-12-27 (×13): qty 2

## 2017-12-27 NOTE — Progress Notes (Signed)
Recreation Therapy Notes  Date: 6.3.19 Time: 1000 Location: 500 Hall Dayroom  Group Topic: Coping Skills  Goal Area(s) Addresses:  Patient will be able to identify positive coping skills. Patient will be able to identify benefits of using coping skills post d/c.  Behavioral Response: Minimal  Intervention: Worksheet, pencils  Activity: Mind map.  LRT and patients filled in the first 8 boxes of the mind map together (guilt, disappointment, sadness, anger, physical harm, depression, anxiety and financial problems).  Patients were to then come up with 3 coping skills for each problem individually before reconvening as a group.  LRT would then fill in the coping skills on the board.   Education: Radiographer, therapeutic, Dentist.   Education Outcome: Acknowledges understanding/In group clarification offered/Needs additional education.   Clinical Observations/Feedback: Pt attempted to complete activity with assistance from LRT.  Pt was unable to focus for extended periods and was talking about various this.  Pt was pleasant throughout activity.      Victorino Sparrow, LRT/CTRS     Ria Comment, Sharmaine Bain A 12/27/2017 11:40 AM

## 2017-12-27 NOTE — Tx Team (Signed)
Interdisciplinary Treatment and Diagnostic Plan Update  12/27/2017 Time of Session: 9:48 AM  Frederick Fletcher MRN: [id number]  Principal Diagnosis: Schizoaffective disorder [facility])  Secondary Diagnoses: Principal Problem:   Schizoaffective disorder ([place])   Current Medications:  Current Facility-Administered Medications  Medication Dose Route Frequency Provider Last Rate Last Dose  . acetaminophen (TYLENOL) tablet 650 mg  650 mg Oral Q6H PRN Money, Lowry Ram, FNP   650 mg at 12/27/17 0749  . alum & mag hydroxide-simeth (MAALOX/MYLANTA) 200-200-20 MG/5ML suspension 30 mL  30 mL Oral Q4H PRN Money, Lowry Ram, FNP      . clonazePAM (KLONOPIN) tablet 0.25 mg  0.25 mg Oral BID PRN Money, Lowry Ram, FNP   0.25 mg at 12/24/17 2004  . diphenhydrAMINE (BENADRYL) capsule 25 mg  25 mg Oral Q6H PRN Money, Lowry Ram, FNP       Or  . diphenhydrAMINE (BENADRYL) injection 25 mg  25 mg Intramuscular Q6H PRN Money, Darnelle Maffucci B, FNP      . divalproex (DEPAKOTE ER) 24 hr tablet 500 mg  500 mg Oral TID Money, Lowry Ram, FNP   500 mg at 12/27/17 0746  . famotidine (PEPCID) tablet 40 mg  40 mg Oral QHS Money, Travis B, FNP   40 mg at 12/26/17 2045  . haloperidol (HALDOL) tablet 5 mg  5 mg Oral Q6H PRN Money, Lowry Ram, FNP       Or  . haloperidol lactate (HALDOL) injection 5 mg  5 mg Intramuscular Q6H PRN Money, Lowry Ram, FNP      . hydrOXYzine (ATARAX/VISTARIL) tablet 25 mg  25 mg Oral TID PRN Money, Lowry Ram, FNP   25 mg at 12/27/17 0748  . LORazepam (ATIVAN) tablet 1 mg  1 mg Oral Q6H PRN Money, Lowry Ram, FNP   1 mg at 12/25/17 1944   Or  . LORazepam (ATIVAN) injection 1 mg  1 mg Intramuscular Q6H PRN Money, Darnelle Maffucci B, FNP      . magnesium hydroxide (MILK OF MAGNESIA) suspension 30 mL  30 mL Oral Daily PRN Money, Darnelle Maffucci B, FNP      . nicotine polacrilex (NICORETTE) gum 2 mg  2 mg Oral PRN Derrill Center, NP      . OLANZapine (ZYPREXA) tablet 15 mg  15 mg Oral QHS Cobos, Myer Peer, MD   15 mg at 12/26/17 2045  .  traZODone (DESYREL) tablet 50 mg  50 mg Oral QHS PRN Money, Lowry Ram, FNP   50 mg at 12/26/17 2046    PTA Medications: Medications Prior to Admission  Medication Sig Dispense Refill Last Dose  . benztropine (COGENTIN) 0.5 MG tablet Take 0.5 mg by mouth 2 (two) times daily.   Past Week at Unknown time  . clonazePAM (KLONOPIN) 0.5 MG tablet Take 0.25 mg by mouth 2 (two) times daily as needed for anxiety.   Past Week at Unknown time  . divalproex (DEPAKOTE ER) 500 MG 24 hr tablet Take 500 mg by mouth 3 (three) times daily.   Past Week at Unknown time  . OLANZapine (ZYPREXA) 20 MG tablet Take 20 mg by mouth 2 (two) times daily.   Past Week at Unknown time  . ranitidine (ZANTAC) 300 MG tablet Take 300 mg by mouth at bedtime as needed for heartburn.   Past Week at Unknown time    Patient Stressors: Medication change or noncompliance  Patient Strengths: Ability for insight Average or above average intelligence General fund of knowledge  Treatment Modalities: Medication  Management, Group therapy, Case management,  1 to 1 session with clinician, Psychoeducation, Recreational therapy.   Physician Treatment Plan for Primary Diagnosis: Schizoaffective disorder ([place]) Long Term Goal(s): Improvement in symptoms so as ready for discharge  Short Term Goals: Ability to identify changes in lifestyle to reduce recurrence of condition will improve Ability to verbalize feelings will improve Ability to demonstrate self-control will improve Compliance with prescribed medications will improve Ability to identify changes in lifestyle to reduce recurrence of condition will improve Ability to identify and develop effective coping behaviors will improve Compliance with prescribed medications will improve  Medication Management: Evaluate patient's response, side effects, and tolerance of medication regimen.  Therapeutic Interventions: 1 to 1 sessions, Unit Group sessions and Medication  administration.  Evaluation of Outcomes: Progressing  Physician Treatment Plan for Secondary Diagnosis: Principal Problem:   Schizoaffective disorder ([place])   Long Term Goal(s): Improvement in symptoms so as ready for discharge  Short Term Goals: Ability to identify changes in lifestyle to reduce recurrence of condition will improve Ability to verbalize feelings will improve Ability to demonstrate self-control will improve Compliance with prescribed medications will improve Ability to identify changes in lifestyle to reduce recurrence of condition will improve Ability to identify and develop effective coping behaviors will improve Compliance with prescribed medications will improve  Medication Management: Evaluate patient's response, side effects, and tolerance of medication regimen.  Therapeutic Interventions: 1 to 1 sessions, Unit Group sessions and Medication administration.  Evaluation of Outcomes: Progressing   RN Treatment Plan for Primary Diagnosis: Schizoaffective disorder ([place]) Long Term Goal(s): Knowledge of disease and therapeutic regimen to maintain health will improve  Short Term Goals: Ability to identify and develop effective coping behaviors will improve and Compliance with prescribed medications will improve  Medication Management: RN will administer medications as ordered by provider, will assess and evaluate patient's response and provide education to patient for prescribed medication. RN will report any adverse and/or side effects to prescribing provider.  Therapeutic Interventions: 1 on 1 counseling sessions, Psychoeducation, Medication administration, Evaluate responses to treatment, Monitor vital signs and CBGs as ordered, Perform/monitor CIWA, COWS, AIMS and Fall Risk screenings as ordered, Perform wound care treatments as ordered.  Evaluation of Outcomes: Progressing   LCSW Treatment Plan for Primary Diagnosis: Schizoaffective disorder ([place]) Long Term  Goal(s): Safe transition to appropriate next level of care at discharge, Engage patient in therapeutic group addressing interpersonal concerns.  Short Term Goals: Engage patient in aftercare planning with referrals and resources  Therapeutic Interventions: Assess for all discharge needs, 1 to 1 time with Social worker, Explore available resources and support systems, Assess for adequacy in community support network, Educate family and significant other(s) on suicide prevention, Complete Psychosocial Assessment, Interpersonal group therapy.  Evaluation of Outcomes: Progressing  Disposition plan unclear at this point   Progress in Treatment: Attending groups: Yes Participating in groups: Yes Taking medication as prescribed: Yes Toleration medication: Yes, no side effects reported at this time Family/Significant other contact made: No Patient understands diagnosis: No Limited insight Discussing patient identified problems/goals with staff: Yes Medical problems stabilized or resolved: Yes Denies suicidal/homicidal ideation: Yes Issues/concerns per patient self-inventory: None Other: N/A  New problem(s) identified: None identified at this time.   New Short Term/Long Term Goal(s): "I'd like to use my head to better myself.  I see pictures of myself on TV. Lucifer will kill my soul."  Discharge Plan or Barriers:   Reason for Continuation of Hospitalization: Disorganization Medication stabilization   Estimated Length of  Stay: 6/7  Attendees: Patient: Frederick Fletcher 12/27/2017  9:48 AM  Physician: Maris Berger, MD 12/27/2017  9:48 AM  Nursing: Elesa Massed, RN 12/27/2017  9:48 AM  RN Care Manager: Lars Pinks, RN 12/27/2017  9:48 AM  Social Worker: Ripley Fraise 12/27/2017  9:48 AM  Recreational Therapist: Winfield Cunas 12/27/2017  9:48 AM  Other: Norberto Sorenson 12/27/2017  9:48 AM  Other:  12/27/2017  9:48 AM    Scribe for Treatment Team:  Roque Lias LCSW 12/27/2017 9:48 AM

## 2017-12-27 NOTE — Progress Notes (Signed)
[facility] MD Progress Note  12/27/2017 1:50 PM ALPHUS ZECK  MRN:  [id number] Subjective:    Frederick Fletcher is a 57 y/o M with history of schizophrenia who was admitted from [facility] ED where he presented brought by police after making SI statements about cutting himself to his landlady. He was medically cleared and then transferred to [facility] for additional treatment and evaluation. He was resumed on home medications of zyprexa, klonopin, and depakote. His depakote level was was found to be therapeutic. Pt reports he was staying in a group home in the [place] area but he does not recall the contact information. Pt has remained generally disorganized in his speech and thoughts, and he continues to endorse AH, but he has been denying SI.  Today upon evaluation, pt remains mildly pressured, tangential, circumstantial, and generally disorganized to the degree that sometimes his speech represents word salad, but he is cooperative with the interview, and he is able to be redirected. Pt jumps from talking about his reasons for admission, the narrative of the events prior to coming to the hospital, his housing situation, his past jobs, and various other topics such as people he knows. Pt shares about his admission that he was "cussin' it out" with Joy who runs the [facility] group where the patient stays; however, this information was unable to be verified. Pt confirms that his address is on [address] in [place] but there are no group homes at that address. Pt denies physical complaints today. He is tolerating his medication well. His appetite is good. He endorses AH that tell him he is "living for things that aren't good." They are not command in nature.  He endorses VH of seeing "rolling cars." He denies SI/HI. He is in agreement to increase his dose of zyprexa this evening. SW team will continue to attempt to identify and reach out to pt's group home. Pt was in agreement with the above plan, and  he had no further questions, comments, or concerns.  Principal Problem: Schizoaffective disorder ([place]) Diagnosis:   Patient Active Problem List   Diagnosis Date Noted  . Schizoaffective disorder ([place]) [F25.9] 12/24/2017   Total Time spent with patient: 30 minutes  Past Psychiatric History: see H&P  Past Medical History: History reviewed. No pertinent past medical history. History reviewed. No pertinent surgical history. Family History: History reviewed. No pertinent family history. Family Psychiatric  History: see H&P Social History:  Social History   Substance and Sexual Activity  Alcohol Use Not Currently  . Frequency: Never     Social History   Substance and Sexual Activity  Drug Use Not Currently    Social History   Socioeconomic History  . Marital status: Single    Spouse name: Not on file  . Number of children: Not on file  . Years of education: Not on file  . Highest education level: Not on file  Occupational History  . Not on file  Social Needs  . Financial resource strain: Not on file  . Food insecurity:    Worry: Not on file    Inability: Not on file  . Transportation needs:    Medical: Not on file    Non-medical: Not on file  Tobacco Use  . Smoking status: Current Every Day Smoker    Packs/day: 1.00    Types: Cigarettes  . Smokeless tobacco: Never Used  Substance and Sexual Activity  . Alcohol use: Not Currently    Frequency: Never  . Drug use:  Not Currently  . Sexual activity: Not Currently  Lifestyle  . Physical activity:    Days per week: Not on file    Minutes per session: Not on file  . Stress: Not on file  Relationships  . Social connections:    Talks on phone: Not on file    Gets together: Not on file    Attends religious service: Not on file    Active member of club or organization: Not on file    Attends meetings of clubs or organizations: Not on file    Relationship status: Not on file  Other Topics Concern  . Not on file   Social History Narrative  . Not on file   Additional Social History:                         Sleep: Fair  Appetite:  Good  Current Medications: Current Facility-Administered Medications  Medication Dose Route Frequency Provider Last Rate Last Dose  . acetaminophen (TYLENOL) tablet 650 mg  650 mg Oral Q6H PRN Money, Lowry Ram, FNP   650 mg at 12/27/17 0749  . alum & mag hydroxide-simeth (MAALOX/MYLANTA) 200-200-20 MG/5ML suspension 30 mL  30 mL Oral Q4H PRN Money, Lowry Ram, FNP      . clonazePAM (KLONOPIN) tablet 0.25 mg  0.25 mg Oral BID PRN Money, Lowry Ram, FNP   0.25 mg at 12/24/17 2004  . diphenhydrAMINE (BENADRYL) capsule 25 mg  25 mg Oral Q6H PRN Money, Lowry Ram, FNP       Or  . diphenhydrAMINE (BENADRYL) injection 25 mg  25 mg Intramuscular Q6H PRN Money, Darnelle Maffucci B, FNP      . divalproex (DEPAKOTE ER) 24 hr tablet 500 mg  500 mg Oral TID Money, Lowry Ram, FNP   500 mg at 12/27/17 0746  . famotidine (PEPCID) tablet 40 mg  40 mg Oral QHS Money, Travis B, FNP   40 mg at 12/26/17 2045  . haloperidol (HALDOL) tablet 5 mg  5 mg Oral Q6H PRN Money, Lowry Ram, FNP       Or  . haloperidol lactate (HALDOL) injection 5 mg  5 mg Intramuscular Q6H PRN Money, Lowry Ram, FNP      . hydrOXYzine (ATARAX/VISTARIL) tablet 25 mg  25 mg Oral TID PRN Money, Lowry Ram, FNP   25 mg at 12/27/17 0748  . LORazepam (ATIVAN) tablet 1 mg  1 mg Oral Q6H PRN Money, Lowry Ram, FNP   1 mg at 12/25/17 1944   Or  . LORazepam (ATIVAN) injection 1 mg  1 mg Intramuscular Q6H PRN Money, Darnelle Maffucci B, FNP      . magnesium hydroxide (MILK OF MAGNESIA) suspension 30 mL  30 mL Oral Daily PRN Money, Darnelle Maffucci B, FNP      . nicotine polacrilex (NICORETTE) gum 2 mg  2 mg Oral PRN Derrill Center, NP      . OLANZapine (ZYPREXA) tablet 15 mg  15 mg Oral QHS Cobos, Myer Peer, MD   15 mg at 12/26/17 2045  . traZODone (DESYREL) tablet 50 mg  50 mg Oral QHS PRN Money, Lowry Ram, FNP   50 mg at 12/26/17 2046    Lab Results:   Results for orders placed or performed during the hospital encounter of 12/24/17 (from the past 48 hour(s))  Hemoglobin A1c     Status: Abnormal   Collection Time: 12/26/17  6:28 AM  Result Value Ref Range   Hgb A1c  MFr Bld 6.5 (H) 4.8 - 5.6 %    Comment: (NOTE) Pre diabetes:          5.7%-6.4% Diabetes:              >6.4% Glycemic control for   <7.0% adults with diabetes    Mean Plasma Glucose 139.85 mg/dL    Comment: Performed at [facility] [address], [place], [place] [address]    Blood Alcohol level:  Lab Results  Component Value Date   ETH <10 84/69/6295    Metabolic Disorder Labs: Lab Results  Component Value Date   HGBA1C 6.5 (H) 12/26/2017   MPG 139.85 12/26/2017   MPG 131.24 12/25/2017   Lab Results  Component Value Date   PROLACTIN 50.5 (H) 12/25/2017   Lab Results  Component Value Date   CHOL 146 12/25/2017   TRIG 64 12/25/2017   HDL 50 12/25/2017   CHOLHDL 2.9 12/25/2017   VLDL 13 12/25/2017   LDLCALC 83 12/25/2017   LDLCALC  01/20/2008    68        Total Cholesterol/HDL:CHD Risk Coronary Heart Disease Risk Table                     Men   Women  1/2 Average Risk   3.4   3.3    Physical Findings: AIMS: Facial and Oral Movements Muscles of Facial Expression: None, normal Lips and Perioral Area: None, normal Jaw: None, normal Tongue: None, normal,Extremity Movements Upper (arms, wrists, hands, fingers): None, normal Lower (legs, knees, ankles, toes): None, normal, Trunk Movements Neck, shoulders, hips: None, normal, Overall Severity Severity of abnormal movements (highest score from questions above): None, normal Incapacitation due to abnormal movements: None, normal Patient's awareness of abnormal movements (rate only patient's report): No Awareness, Dental Status Current problems with teeth and/or dentures?: No Does patient usually wear dentures?: No  CIWA:  CIWA-Ar Total: 4 COWS:  COWS Total Score:  0  Musculoskeletal: Strength & Muscle Tone: within normal limits Gait & Station: normal Patient leans: N/A  Psychiatric Specialty Exam: Physical Exam  Nursing note and vitals reviewed.   Review of Systems  Constitutional: Negative for chills and fever.  Respiratory: Negative for cough and shortness of breath.   Cardiovascular: Negative for chest pain.  Gastrointestinal: Negative for abdominal pain, heartburn, nausea and vomiting.  Psychiatric/Behavioral: Positive for hallucinations. Negative for depression and suicidal ideas. The patient is not nervous/anxious and does not have insomnia.     Blood pressure 97/68, pulse 83, temperature 97.8 F (36.6 C), temperature source Oral, resp. rate 18, height 6' (1.829 m), weight 61.2 kg (135 lb).Body mass index is 18.31 kg/m.  General Appearance: Casual and Disheveled  Eye Contact:  Good  Speech:  Garbled and Pressured  Volume:  Normal  Mood:  Euthymic  Affect:  Blunt and Labile  Thought Process:  Disorganized, Goal Directed and Descriptions of Associations: Tangential  Orientation:  Full (Time, Place, and Person)  Thought Content:  Delusions, Hallucinations: Auditory Visual and Tangential  Suicidal Thoughts:  No  Homicidal Thoughts:  No  Memory:  Immediate;   Fair Recent;   Fair Remote;   Fair  Judgement:  Poor  Insight:  Lacking  Psychomotor Activity:  Normal  Concentration:  Concentration: Fair  Recall:  [employer] of Knowledge:  Fair  Language:  Fair  Akathisia:  No  Handed:    AIMS (if indicated):     Assets:  Communication Skills Resilience Social  Support  ADL's:  Intact  Cognition:  WNL  Sleep:  Number of Hours: 3    Treatment Plan Summary: Daily contact with patient to assess and evaluate symptoms and progress in treatment and Medication management   -Continue inpatient hospitalization  -Schizoaffective disorder, bipolar type            - ChangeDepakote 500 ER mg TID to Depakote DR 500mg  qAM + 1000mg  qHs     -Change zyprexa 15mg  po qhs to zyprexa 20mg  po qhs  -Agitation   -Continue benadryl 25mg  po/IM q6h prn agitation   -Continue haldol 5mg  po/IM q6h prn agitation   -Continue ativan 1mg  po/IM q6h prn agitation  -insomnia    -Continue with Trazodone50mg  po qhs prn insomnia (may repeat x1)  -Anxiety  -Discontinue klonopin 0.25mg  po BID prn anxiety   - Continue vistaril 25mg  po q8h prn anxiety  -Encourage participation in groups and therapeutic milieu  -Disposition planning will be ongoing   Pennelope Bracken, MD 12/27/2017, 1:50 PM

## 2017-12-27 NOTE — Progress Notes (Signed)
Patient has been pacing the hall actively engaged in conversation with unseen others.  Patient is noted to be loud, pressured and tangential.  Patient's conversation with others is often nonsensical.   Assess patient for safety, offer medications as prescribed, engage patient in 1:1 staff talks.   Patient able to contract for safety.  Continue to monitor as planned.

## 2017-12-27 NOTE — Plan of Care (Signed)
  Problem: Safety: Goal: Periods of time without injury will increase Outcome: Progressing Note:  Pt has not harmed self or others tonight.  He denies SI/HI and verbally contracts for safety.   

## 2017-12-27 NOTE — Progress Notes (Signed)
Recreation Therapy Notes  INPATIENT RECREATION THERAPY ASSESSMENT  Patient Details Name: Frederick Fletcher MRN: [id number] DOB: 09/02/1960 Today's Date: 12/27/2017       Information Obtained From: Patient  Able to Participate in Assessment/Interview: Yes  Patient Presentation: Alert, Hyperverbal  Reason for Admission (Per Patient): Other (Comments)(Pt stated he cursed the lady out he was staying with for taking his money.)  Patient Stressors: Work  Radiographer, therapeutic:   Arguments, Aggression, Music, Sports, TV, Substance Abuse, Talk, Read  Leisure Interests (2+):  [employer], [place], Sports - Other (Comment), [employer] - Other (Comment)(watch volleyball games; bbq in the park)  Frequency of Recreation/Participation: Other (Comment)("Not lately")  Awareness of Community Resources:  Yes  Community Resources:  Park, Patent examiner  Current Use: No  If no, Barriers?: Other (Comment)(Pt stated the program that used to take him to these places has closed.)  Expressed Interest in [place]: No  [employer] of Residence:  (Pt stated [place]; chart stated [place])  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

## 2017-12-27 NOTE — [facility] Group Notes (Signed)
LCSW Group Therapy Note   12/27/2017 1:15pm   Type of Therapy and Topic:  Group Therapy:  Overcoming Obstacles   Participation Level:  Active   Description of Group:    In this group patients will be encouraged to explore what they see as obstacles to their own wellness and recovery. They will be guided to discuss their thoughts, feelings, and behaviors related to these obstacles. The group will process together ways to cope with barriers, with attention given to specific choices patients can make. Each patient will be challenged to identify changes they are motivated to make in order to overcome their obstacles. This group will be process-oriented, with patients participating in exploration of their own experiences as well as giving and receiving support and challenge from other group members.   Therapeutic Goals: 1. Patient will identify personal and current obstacles as they relate to admission. 2. Patient will identify barriers that currently interfere with their wellness or overcoming obstacles.  3. Patient will identify feelings, thought process and behaviors related to these barriers. 4. Patient will identify two changes they are willing to make to overcome these obstacles:      Summary of Patient Progress  Made many contributions, most of them non-sensical and difficult to impossible to understand.  Other patients simply smiled when he was talking.    Therapeutic Modalities:   Cognitive Behavioral Therapy Solution Focused Therapy Motivational Interviewing Relapse Prevention Therapy  Trish Mage, LCSW 12/27/2017 3:17 PM

## 2017-12-27 NOTE — Progress Notes (Signed)
D: Pt was in dayroom upon initial approach.  He presents with euphoric affect and preoccupied mood.  His speech is loud, pressured, disorganized, and difficult to understand at times.  When asked what his goal is, he states "have the union take it all.. For yourself... And holy union."  He denies SI/HI and pain.  When asked if he is having hallucinations, he states "still going" and he appears to be responding to auditory hallucinations.  Pt has been visible in milieu interacting with peers.  He attended evening group.  A: Introduced self to pt.  Actively listened to pt and offered support and encouragement.  Medication administered per order.  PRN medication administered for sleep.  Q15 minute safety checks maintained.  R: Pt is compliant with medications.  He verbally contracts for safety.  Will continue to monitor and assess.

## 2017-12-28 NOTE — Progress Notes (Signed)
Adult Psychoeducational Group Note  Date:  12/28/2017 Time:  8:21 PM  Group Topic/Focus:  Wrap-Up Group:   The focus of this group is to help patients review their daily goal of treatment and discuss progress on daily workbooks.  Participation Level:  Active  Participation Quality:  Appropriate  Affect:  Appropriate  Cognitive:  Appropriate  Insight: Appropriate  Engagement in Group:  Improving  Modes of Intervention:  Discussion  Additional Comments:  The patient expressed that he rates today a 9.The patient also said that he attend all  Groups.  Nash Shearer 12/28/2017, 8:21 PM

## 2017-12-28 NOTE — Progress Notes (Signed)
[facility] MD Progress Note  12/28/2017 1:31 PM LADISLAO COHENOUR  MRN:  [id number] Subjective:    Frederick Fletcher is a 57 y/o M with history of schizophrenia who was admitted from [facility] ED where he presented brought by police after making SI statements about cutting himself to his landlady. He was medically cleared and then transferred to [facility] for additional treatment and evaluation. He was resumed on home medications of zyprexa, klonopin, and depakote. His depakote level was was found to be therapeutic. Pt reports he was staying in a group home in the [place] area but he does not recall the contact information. Pt has remained generally disorganized in his speech and thoughts, and he continues to endorse AH, but he has been denying SI. His dose of zyprexa has been increased during his stay, and his depakote was changed to be only twice per day (same total daily dose).  Today upon evaluation, pt remains pressured, tangential, and disorganized in his speech. He is pleasant and cooperative, but he is difficult to understand and track due to his significant tangentiality. Pt denies any specific concerns today. He is sleeping well. His appetite is good. He denies SI/HI/AH/VH today. He reports that he is tolerating his current medications well, and he denies any specific concerns about them. Pt is unable to name exactly where he was staying in [place], but he indicates it was a 1-bedroom home and from his description it does not sound like a group home. Pt would like to stay in a shelter after discharge and he names [facility] as a possibility. Discussed with patient that he will need some additional stabilization in the hospital prior to discharge, but we will begin to formulate his discharge plan. Pt was in agreement with that plan, and he had no further questions, comments, or concerns.  Principal Problem: Schizoaffective disorder, bipolar type ([place]) Diagnosis:   Patient Active Problem List    Diagnosis Date Noted  . Schizoaffective disorder, bipolar type ([place]) [F25.0] 12/24/2017   Total Time spent with patient: 30 minutes  Past Psychiatric History: see H&P  Past Medical History: History reviewed. No pertinent past medical history. History reviewed. No pertinent surgical history. Family History: History reviewed. No pertinent family history. Family Psychiatric  History: see H&P Social History:  Social History   Substance and Sexual Activity  Alcohol Use Not Currently  . Frequency: Never     Social History   Substance and Sexual Activity  Drug Use Not Currently    Social History   Socioeconomic History  . Marital status: Single    Spouse name: Not on file  . Number of children: Not on file  . Years of education: Not on file  . Highest education level: Not on file  Occupational History  . Not on file  Social Needs  . Financial resource strain: Not on file  . Food insecurity:    Worry: Not on file    Inability: Not on file  . Transportation needs:    Medical: Not on file    Non-medical: Not on file  Tobacco Use  . Smoking status: Current Every Day Smoker    Packs/day: 1.00    Types: Cigarettes  . Smokeless tobacco: Never Used  Substance and Sexual Activity  . Alcohol use: Not Currently    Frequency: Never  . Drug use: Not Currently  . Sexual activity: Not Currently  Lifestyle  . Physical activity:    Days per week: Not on file  Minutes per session: Not on file  . Stress: Not on file  Relationships  . Social connections:    Talks on phone: Not on file    Gets together: Not on file    Attends religious service: Not on file    Active member of club or organization: Not on file    Attends meetings of clubs or organizations: Not on file    Relationship status: Not on file  Other Topics Concern  . Not on file  Social History Narrative  . Not on file   Additional Social History:                         Sleep: Good  Appetite:   Good  Current Medications: Current Facility-Administered Medications  Medication Dose Route Frequency Provider Last Rate Last Dose  . acetaminophen (TYLENOL) tablet 650 mg  650 mg Oral Q6H PRN Money, Lowry Ram, FNP   650 mg at 12/28/17 0751  . alum & mag hydroxide-simeth (MAALOX/MYLANTA) 200-200-20 MG/5ML suspension 30 mL  30 mL Oral Q4H PRN Money, Darnelle Maffucci B, FNP      . diphenhydrAMINE (BENADRYL) capsule 25 mg  25 mg Oral Q6H PRN Money, Lowry Ram, FNP       Or  . diphenhydrAMINE (BENADRYL) injection 25 mg  25 mg Intramuscular Q6H PRN Money, Darnelle Maffucci B, FNP      . divalproex (DEPAKOTE) DR tablet 500 mg  500 mg Oral Tana Conch T, MD   500 mg at 12/28/17 0258   And  . divalproex (DEPAKOTE) DR tablet 1,000 mg  1,000 mg Oral QHS Pennelope Bracken, MD   1,000 mg at 12/27/17 2105  . famotidine (PEPCID) tablet 40 mg  40 mg Oral QHS Money, Travis B, FNP   40 mg at 12/27/17 2104  . haloperidol (HALDOL) tablet 5 mg  5 mg Oral Q6H PRN Money, Lowry Ram, FNP       Or  . haloperidol lactate (HALDOL) injection 5 mg  5 mg Intramuscular Q6H PRN Money, Lowry Ram, FNP      . hydrOXYzine (ATARAX/VISTARIL) tablet 25 mg  25 mg Oral TID PRN Money, Lowry Ram, FNP   25 mg at 12/28/17 0751  . LORazepam (ATIVAN) tablet 1 mg  1 mg Oral Q6H PRN Money, Lowry Ram, FNP   1 mg at 12/25/17 1944   Or  . LORazepam (ATIVAN) injection 1 mg  1 mg Intramuscular Q6H PRN Money, Darnelle Maffucci B, FNP      . magnesium hydroxide (MILK OF MAGNESIA) suspension 30 mL  30 mL Oral Daily PRN Money, Darnelle Maffucci B, FNP      . nicotine polacrilex (NICORETTE) gum 2 mg  2 mg Oral PRN Derrill Center, NP      . OLANZapine (ZYPREXA) tablet 20 mg  20 mg Oral QHS Pennelope Bracken, MD   20 mg at 12/27/17 2104  . traZODone (DESYREL) tablet 50 mg  50 mg Oral QHS PRN,MR X 1 Glenn Gullickson, Randa Ngo, MD   50 mg at 12/28/17 0106    Lab Results: No results found for this or any previous visit (from the past 48 hour(s)).  Blood Alcohol level:   Lab Results  Component Value Date   ETH <10 52/77/8242    Metabolic Disorder Labs: Lab Results  Component Value Date   HGBA1C 6.5 (H) 12/26/2017   MPG 139.85 12/26/2017   MPG 131.24 12/25/2017   Lab Results  Component Value Date  PROLACTIN 50.5 (H) 12/25/2017   Lab Results  Component Value Date   CHOL 146 12/25/2017   TRIG 64 12/25/2017   HDL 50 12/25/2017   CHOLHDL 2.9 12/25/2017   VLDL 13 12/25/2017   LDLCALC 83 12/25/2017   LDLCALC  01/20/2008    68        Total Cholesterol/HDL:CHD Risk Coronary Heart Disease Risk Table                     Men   Women  1/2 Average Risk   3.4   3.3    Physical Findings: AIMS: Facial and Oral Movements Muscles of Facial Expression: None, normal Lips and Perioral Area: None, normal Jaw: None, normal Tongue: None, normal,Extremity Movements Upper (arms, wrists, hands, fingers): None, normal Lower (legs, knees, ankles, toes): None, normal, Trunk Movements Neck, shoulders, hips: None, normal, Overall Severity Severity of abnormal movements (highest score from questions above): None, normal Incapacitation due to abnormal movements: None, normal Patient's awareness of abnormal movements (rate only patient's report): No Awareness, Dental Status Current problems with teeth and/or dentures?: No Does patient usually wear dentures?: No  CIWA:  CIWA-Ar Total: 0 COWS:  COWS Total Score: 2  Musculoskeletal: Strength & Muscle Tone: within normal limits Gait & Station: normal Patient leans: N/A  Psychiatric Specialty Exam: Physical Exam  Nursing note and vitals reviewed.   Review of Systems  Constitutional: Negative for chills and fever.  Respiratory: Negative for cough and shortness of breath.   Cardiovascular: Negative for chest pain.  Gastrointestinal: Negative for abdominal pain, heartburn, nausea and vomiting.  Psychiatric/Behavioral: Negative for depression, hallucinations and suicidal ideas. The patient is not nervous/anxious  and does not have insomnia.     Blood pressure (!) 78/65, pulse (!) 108, temperature 98.4 F (36.9 C), temperature source Oral, resp. rate 18, height 6' (1.829 m), weight 61.2 kg (135 lb).Body mass index is 18.31 kg/m.  General Appearance: Casual and Disheveled  Eye Contact:  Fair  Speech:  Garbled and Pressured  Volume:  Normal  Mood:  Euthymic  Affect:  Appropriate and Blunt  Thought Process:  Coherent and Goal Directed  Orientation:  Full (Time, Place, and Person)  Thought Content:  Tangential  Suicidal Thoughts:  No  Homicidal Thoughts:  No  Memory:  Immediate;   Fair Recent;   Fair Remote;   Fair  Judgement:  Poor  Insight:  Lacking  Psychomotor Activity:  Normal  Concentration:  Concentration: Fair  Recall:  [employer] of Knowledge:  Fair  Language:  Fair  Akathisia:  No  Handed:    AIMS (if indicated):     Assets:  Resilience  ADL's:  Intact  Cognition:  WNL  Sleep:  Number of Hours: 5   Treatment Plan Summary: Daily contact with patient to assess and evaluate symptoms and progress in treatment and Medication management   -Continue inpatient hospitalization  -Schizoaffective disorder, bipolar type -Continue Depakote DR 500mg  qAM + 1000mg  qHs              -Continue zyprexa 20mg  po qhs  -Agitation             -Continue benadryl 25mg  po/IM q6h prn agitation             -Continue haldol 5mg  po/IM q6h prn agitation             -Continue ativan 1mg  po/IM q6h prn agitation  -insomnia             -  Continue with Trazodone50mg  po qhs prn insomnia (may repeat x1)  -Anxiety             - Continue vistaril 25mg  po q8h prn anxiety  -Encourage participation in groups and therapeutic milieu  -Disposition planning will be ongoing  Pennelope Bracken, MD 12/28/2017, 1:31 PM

## 2017-12-28 NOTE — Progress Notes (Signed)
Recreation Therapy Notes  Date: 6.4.19 Time: 1000 Location: 500 Hall  Group Topic: Communication, Team Building, Problem Solving  Goal Area(s) Addresses:  Patient will effectively work with peer towards shared goal.  Patient will identify skill used to make activity successful.  Patient will identify how skills used during activity can be used to reach post d/c goals.   Behavioral Response: Engaged  Intervention: Trust Activity  Activity:  Patients were given rubber discs (one more than the number of people doing the activity).  Patients were to use the rubber discs to maneuver from one end of the hall to the other and back to the starting point.  Patients were to remain on their disc at all times, if anyone stepped off, the group would have to start over from the beginning.   Education: Education officer, community, Dentist.   Education Outcome: Acknowledges education/In group clarification offered/Needs additional education.   Clinical Observations/Feedback: Pt was engaged. Pt needed redirection to focus on activity.  Pt had a hard time following the directions for the activity but he still participated.    Victorino Sparrow, LRT/CTRS     Ria Comment, Labresha Mellor A 12/28/2017 11:09 AM

## 2017-12-28 NOTE — Progress Notes (Signed)
D: Pt was in dayroom upon initial approach.  He presents with euphoric affect and preoccupied mood.  Pt smiles upon approach and describes his day as "great."  He reports he had a "good breakfast, good lunch, good dinner."  Pt reports he attended all groups today.  His speech is tangential and thought process disorganized at times.  He discussed how his hallucinations have "cleared up" and he "might be going to a group home."  Pt was redirected for talking about crack cocaine.  He reports that his medications are helping.  Denies SI/HI, hallucinations, and pain.  Pt has been visible in milieu.  He continues to be intrusive at times.  Pt attended evening group.  A: Met with pt and provided support and encouragement.  Medication administered per request.  During medication pass, pt complained of severe anxiety.  PRN medication was administered for sleep and severe anxiety.  Fall prevention techniques reviewed with pt and he verbalized understanding.  Q15 minute safety checks maintained.  R: Pt is compliant with medications.  He verbally contracts for safety and reports he will inform staff of needs and concerns.  Will continue to monitor and assess.

## 2017-12-28 NOTE — Progress Notes (Signed)
Patient ID: Frederick Fletcher, male   DOB: Jul 08, 1961, 57 y.o.   MRN: [id number] PER STATE REGULATIONS 482.30  THIS CHART WAS REVIEWED FOR MEDICAL NECESSITY WITH RESPECT TO THE PATIENT'S ADMISSION/ DURATION OF STAY.  NEXT REVIEW DATE: 01/01/2018  Chauncy Lean, RN, BSN CASE MANAGER

## 2017-12-28 NOTE — BHH Counselor (Addendum)
Adult Comprehensive Assessment  Patient ID: Frederick Fletcher, male   DOB: 1961/01/30, 57 y.o.   MRN: [id number]  Information Source: Information source: Patient  Current Stressors:  Patient states their primary concerns and needs for treatment are:: "I'd like to use my head to better myself." Patient states their goals for this hospitilization and ongoing recovery are:: same as above Employment / Job issues: Dsiability Family Relationships: Unknown-appears to have none Museum/gallery curator / Lack of resources (include bankruptcy): Fixed income Housing / Lack of housing: States he was last living at [facility] whether this was boarding house, group home, or other, but he states hge will not be returning there Substance abuse: UDS negative  Living/Environment/Situation:  Living Arrangements: Other (Comment)(Homeless) Living conditions (as described by patient or guardian): States he was living at [facility] "for about 14 years" until he got into some rucus with "Joy" who sounds like she may be the landlord, and he left and went to [employer] with SI What is atmosphere in current home: Temporary  Family History:     Childhood History:     Education:     Employment/Work Situation:   Employment situation: On disability Why is patient on disability: mental health How long has patient been on disability: unknown What is the longest time patient has a held a job?: unknown Where was the patient employed at that time?: unknown Are There Guns or Other Weapons in [place]?: No  Financial Resources:      Alcohol/Substance Abuse:      Social Support System:      Leisure/Recreation:      Strengths/Needs:   What is the patient's perception of their strengths?: irrelevent thoughts, speech Patient states they can use these personal strengths during their treatment to contribute to their recovery: Irrelevent thoughts, speech Patient states these barriers  may affect/interfere with their treatment: irrelevent thoughts, speech Patient states these barriers may affect their return to the community: irrelevent thoughts, speech Other important information patient would like considered in planning for their treatment: None  Discharge Plan:   Currently receiving community mental health services: No Patient states concerns and preferences for aftercare planning are: States he has a PCP he wants to see Patient states they will know when they are safe and ready for discharge when: irrelevent thoughts, speech Does patient have access to transportation?: Yes Does patient have financial barriers related to discharge medications?: No Plan for living situation after discharge: unknown Will patient be returning to same living situation after discharge?: No  Summary/Recommendations:   Summary and Recommendations (to be completed by the evaluator): Frederick Fletcher is a 57 YO Caucasian male diagnosed with Schizoaffective D/O. He presents voluntarily via [facility] hospital-he insists it was [facility]-where he walked after he left his residence and asked for help with SI.  He is disorganized, and irrelevent with presured speech-is not easily redirected.  Frederick Fletcher was unable to give any relenent information about himself and his recent past, and the chart gave no phone numbers nor names of people to contact re: his recent past, or any past for that matter.  In some ways, Frederick Fletcher is a true [employer].  His future plans are unknown, but while with [place], Frederick Fletcher can benefit from crises stabilization, medication management, therapeutic milieu and referral for services.  Trish Mage. 12/28/2017

## 2017-12-28 NOTE — BHH Suicide Risk Assessment (Signed)
[facility] INPATIENT:  Family/Significant Other Suicide Prevention Education  Suicide Prevention Education:  Patient Refusal for Family/Significant Other Suicide Prevention Education: The patient Frederick Fletcher has refused to provide written consent for family/significant other to be provided Family/Significant Other Suicide Prevention Education during admission and/or prior to discharge.  Physician notified. Pt did not refuse.  He simply could not come up with a name or phone number of anyone to call.  Trish Mage 12/28/2017, 2:26 PM

## 2017-12-28 NOTE — BHH Group Notes (Signed)
LCSW Group Therapy Note  12/28/2017 1:15pm  Type of Therapy/Topic:  Group Therapy:  Feelings about Diagnosis  Participation Level:  None   Description of Group:   This group will allow patients to explore their thoughts and feelings about diagnoses they have received. Patients will be guided to explore their level of understanding and acceptance of these diagnoses. Facilitator will encourage patients to process their thoughts and feelings about the reactions of others to their diagnosis and will guide patients in identifying ways to discuss their diagnosis with significant others in their lives. This group will be process-oriented, with patients participating in exploration of their own experiences, giving and receiving support, and processing challenge from other group members.   Therapeutic Goals: 1. Patient will demonstrate understanding of diagnosis as evidenced by identifying two or more symptoms of the disorder 2. Patient will be able to express two feelings regarding the diagnosis 3. Patient will demonstrate their ability to communicate their needs through discussion and/or role play  Summary of Patient Progress:  Stayed the entire time, until I encouraged him to leave.  He took no offense, and left.  Kept up a running commentary the entire time he was in group.  Non-stop.  Periodically, I heard a word or phrase that sounded appropo to the subject, but when I asked him to clarify, he came back with something nonsensical. .       Therapeutic Modalities:   Cognitive Behavioral Therapy Brief Therapy Feelings Identification    Trish Mage, LCSW 12/28/2017 1:23 PM

## 2017-12-28 NOTE — Plan of Care (Signed)
Patient participating in plan of care to the best of his ability. Continues to display pressured, disorganized speech, and response to internal stimuli. Attending group therapy sessions. Unable to give information on his living situation.

## 2017-12-28 NOTE — Progress Notes (Signed)
D: Patient presents loud, intrusive, disorganized, tangential. Patient is orthostatic, provider notified. Patient presents manic "Having a good damn old day." Continuing to respond to internal stimuli, but denies AVH "They done cleared up" referring to the voices. He reports sleeping well and received medication last night that was helpful. Appetite is good, energy level "hyper." Patient rates his depression 5/10 and hopelessness 4/10. Patient denies SI/HI.  A: Patient checked q15 min, and checks reviewed. Reviewed medication changes with patient and educated on side effects. Educated patient on importance of attending group therapy sessions and educated on several coping skills. Encouarged participation in milieu through recreation therapy and attending meals with peers. Provided support and encouragement. R: Patient receptive to education on medications, and is medication compliant. Patient attending all group therapy sessions, recreation time, and cafeteria with peers. On his self inventory, he started writing nonsensical math equations in response to his daily goals and how to meet them. Patient contracts for safety on the unit.

## 2017-12-28 NOTE — Plan of Care (Signed)
  Problem: Health Behavior/Discharge Planning: Goal: Compliance with prescribed medication regimen will improve Outcome: Progressing Note:  Pt has been compliant with medications tonight.
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
## 2017-12-29 NOTE — Progress Notes (Signed)
Recreation Therapy Notes  Date: 6.5.19 Time: 1000 Location: 500 Hall Dayroom  Group Topic: Wellness  Goal Area(s) Addresses:  Patient will define components of whole wellness. Patient will verbalize benefit of whole wellness.  Behavioral Response: Engaged  Intervention:  Exercise     Activity: LRT and tech [employer]) lead patients in a series of stretches before allowing each patient to lead the group in an exercise of their choice.    Education: Wellness, Dentist.   Education Outcome: Acknowledges education/In group clarification offered/Needs additional education.   Clinical Observations/Feedback: Pt was very engaged and energetic during activity.  Pt completed all the exercises.  At conclusion of group, pt stated he felt "my endorphins and energized".    Victorino Sparrow, LRT/CTRS      Ria Comment, Jamian Andujo A 12/29/2017 11:07 AM

## 2017-12-29 NOTE — Progress Notes (Signed)
Pt was loudly talking in his room and sounded irritable.  Writer talked to pt and he states "I'm mad."  When asked why, he reports it is because he has been thinking about his family.  Writer was unable to understand some of what pt was saying.  PRN medication administered for agitation and sleep.  Encouraged pt to try to get some rest.  Pt reports "this should do it" and returned to his room.  Will continue to monitor and assess.
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
## 2017-12-29 NOTE — Plan of Care (Signed)
  Problem: Activity: Goal: Sleeping patterns will improve Outcome: Progressing Note:  Slept 6.25 hours last night according to flowsheet.

## 2017-12-29 NOTE — Progress Notes (Signed)
Adult Psychoeducational Group Note  Date:  12/29/2017 Time:  8:45 PM  Group Topic/Focus:  Wrap-Up Group:   The focus of this group is to help patients review their daily goal of treatment and discuss progress on daily workbooks.  Participation Level:  Active  Participation Quality:  Redirectable  Affect:  Labile  Cognitive:  Oriented  Insight: Lacking  Engagement in Group:  Engaged  Modes of Intervention:  Socialization and Support  Additional Comments:  Patient attended and participated in group tonight. He reports that his day went well. He took his medications, went to meals and groups. Today he also spoke with his Doctor.  Salley Scarlet [facility] 12/29/2017, 8:45 PM

## 2017-12-29 NOTE — Progress Notes (Signed)
Patient ID: Frederick Fletcher, male   DOB: [birth year], 57 y.o.   MRN: [id number] D) Pt affect bright. Mood pleasant and calm. Pt is responding to internal stimuli and can be loud at times while remaining calm. Pt is out in the dayroom responding to internal stimuli and laughing inappropriately.  In conversation pt thoughts disorganized and tangential. Pt frequently mentions group home and cocaine and possibly something sexual. Speech is difficult to understand. A) Level 3 obs for safety, support and encourage. Med ed reinforced. R) Cooperative.

## 2017-12-29 NOTE — Progress Notes (Signed)
D: Pt was in the dayroom upon initial approach.  He presents with euphoric, preoccupied affect and preoccupied mood.  He smiles frequently and is pleasant while interacting with others.  His speech remains pressured, loud, and tangential.  Thought process is disorganized at times.  He states he is "fixin' to go to a group home with Ms. Rosana Hoes Friday."  He reports he feels safe with this plan.  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  When asked if he is having hallucinations, he states "that's over with thank God, the boat was running one more time."  Pt attended evening group tonight.  A: Met with pt 1:1 and provided support and encouragement.  Medication administered per order.  Actively listened to pt.  PRN medication administered for sleep.  Q15 minute safety checks maintained.  R: Pt is compliant with medications.  He verbally contracts for safety and reports he will inform staff of needs and concerns.  Will continue to monitor and assess.

## 2017-12-30 NOTE — Progress Notes (Signed)
[facility] MD Progress Note  12/30/2017 12:49 PM Frederick Fletcher  MRN:  [id number]  Subjective: Frederick Fletcher reports illogically with slurred speech; "How is you? I'm going through some mental change after I took some drugs. It blew my mind".  Frederick Fletcher is a 57 y/o M with history of schizophrenia who was admitted from [facility] ED where he presented brought by police after making SI statements about cutting himself to his landlady. He was medically cleared and then transferred to [facility] for additional treatment and evaluation. He was resumed on home medications of zyprexa, klonopin, and depakote. His depakote level was was found to be therapeutic. Pt reports he was staying in a group home in the [place] area but he does not recall the contact information. Pt has remained generally disorganized in his speech and thoughts, and he continues to endorse AH, but he has been denying SI. His dose of zyprexa has been increased during his stay, and his depakote was changed to be only twice per day (same total daily dose). Pt has been generally appropriate and participating on the unit, but he has occasionally been making inappropriate comments as per RN staff, but he is generally able to be redirected.  Today upon evaluation, Frederick Fletcher remains alert, oriented to name, friendly with pressured speech, tangentiality & disorganization of his speech. He presents at times, inappropriate. Pt denies any specific concerns today. He is sleeping well. His appetite is good. He denies SI/HI.  He is tolerating his medications without difficulty or side effects. He is in agreement to continue his current regimen without changes. Pt expressed previously that he would like help with referral to a group home as he has previously lived in one and done well. He agrees to speak with SW team about his options. He had no further questions, comments, or concerns.    Principal Problem: Schizoaffective disorder, bipolar type ([place])  Diagnosis:    Patient Active Problem List   Diagnosis Date Noted  . Schizoaffective disorder, bipolar type ([place]) [F25.0] 12/24/2017   Total Time spent with patient: 15 minutes  Past Psychiatric History: See H&P  Past Medical History: History reviewed. No pertinent past medical history. History reviewed. No pertinent surgical history. Family History: History reviewed. No pertinent family history.  Family Psychiatric  History: See H&P  Social History:  Social History   Substance and Sexual Activity  Alcohol Use Not Currently  . Frequency: Never     Social History   Substance and Sexual Activity  Drug Use Not Currently    Social History   Socioeconomic History  . Marital status: Single    Spouse name: Not on file  . Number of children: Not on file  . Years of education: Not on file  . Highest education level: Not on file  Occupational History  . Not on file  Social Needs  . Financial resource strain: Not on file  . Food insecurity:    Worry: Not on file    Inability: Not on file  . Transportation needs:    Medical: Not on file    Non-medical: Not on file  Tobacco Use  . Smoking status: Current Every Day Smoker    Packs/day: 1.00    Types: Cigarettes  . Smokeless tobacco: Never Used  Substance and Sexual Activity  . Alcohol use: Not Currently    Frequency: Never  . Drug use: Not Currently  . Sexual activity: Not Currently  Lifestyle  . Physical activity:    Days per week:  Not on file    Minutes per session: Not on file  . Stress: Not on file  Relationships  . Social connections:    Talks on phone: Not on file    Gets together: Not on file    Attends religious service: Not on file    Active member of club or organization: Not on file    Attends meetings of clubs or organizations: Not on file    Relationship status: Not on file  Other Topics Concern  . Not on file  Social History Narrative  . Not on file   Additional Social History:   Sleep: Good  Appetite:   Good  Current Medications: Current Facility-Administered Medications  Medication Dose Route Frequency Provider Last Rate Last Dose  . acetaminophen (TYLENOL) tablet 650 mg  650 mg Oral Q6H PRN Money, Lowry Ram, FNP   650 mg at 12/28/17 0751  . alum & mag hydroxide-simeth (MAALOX/MYLANTA) 200-200-20 MG/5ML suspension 30 mL  30 mL Oral Q4H PRN Money, Darnelle Maffucci B, FNP      . diphenhydrAMINE (BENADRYL) capsule 25 mg  25 mg Oral Q6H PRN Money, Lowry Ram, FNP       Or  . diphenhydrAMINE (BENADRYL) injection 25 mg  25 mg Intramuscular Q6H PRN Money, Darnelle Maffucci B, FNP      . divalproex (DEPAKOTE) DR tablet 500 mg  500 mg Oral Rockne Menghini, MD   500 mg at 12/30/17 4627   And  . divalproex (DEPAKOTE) DR tablet 1,000 mg  1,000 mg Oral QHS Pennelope Bracken, MD   1,000 mg at 12/29/17 2107  . famotidine (PEPCID) tablet 40 mg  40 mg Oral QHS Money, Travis B, FNP   40 mg at 12/29/17 2107  . haloperidol (HALDOL) tablet 5 mg  5 mg Oral Q6H PRN Money, Lowry Ram, FNP       Or  . haloperidol lactate (HALDOL) injection 5 mg  5 mg Intramuscular Q6H PRN Money, Lowry Ram, FNP      . hydrOXYzine (ATARAX/VISTARIL) tablet 25 mg  25 mg Oral TID PRN Money, Lowry Ram, FNP   25 mg at 12/28/17 0751  . LORazepam (ATIVAN) tablet 1 mg  1 mg Oral Q6H PRN Money, Lowry Ram, FNP   1 mg at 12/29/17 2354   Or  . LORazepam (ATIVAN) injection 1 mg  1 mg Intramuscular Q6H PRN Money, Darnelle Maffucci B, FNP      . magnesium hydroxide (MILK OF MAGNESIA) suspension 30 mL  30 mL Oral Daily PRN Money, Darnelle Maffucci B, FNP      . nicotine polacrilex (NICORETTE) gum 2 mg  2 mg Oral PRN Derrill Center, NP      . OLANZapine (ZYPREXA) tablet 20 mg  20 mg Oral QHS Pennelope Bracken, MD   20 mg at 12/29/17 2107  . traZODone (DESYREL) tablet 50 mg  50 mg Oral QHS PRN,MR X 1 [place], Randa Ngo, MD   50 mg at 12/29/17 2354    Lab Results: No results found for this or any previous visit (from the past 48 hour(s)).  Blood Alcohol level:   Lab Results  Component Value Date   ETH <10 03/50/0938    Metabolic Disorder Labs: Lab Results  Component Value Date   HGBA1C 6.5 (H) 12/26/2017   MPG 139.85 12/26/2017   MPG 131.24 12/25/2017   Lab Results  Component Value Date   PROLACTIN 50.5 (H) 12/25/2017   Lab Results  Component Value Date   CHOL  146 12/25/2017   TRIG 64 12/25/2017   HDL 50 12/25/2017   CHOLHDL 2.9 12/25/2017   VLDL 13 12/25/2017   LDLCALC 83 12/25/2017   LDLCALC  01/20/2008    68        Total Cholesterol/HDL:CHD Risk Coronary Heart Disease Risk Table                     Men   Women  1/2 Average Risk   3.4   3.3    Physical Findings: AIMS: Facial and Oral Movements Muscles of Facial Expression: None, normal Lips and Perioral Area: None, normal Jaw: None, normal Tongue: None, normal,Extremity Movements Upper (arms, wrists, hands, fingers): None, normal Lower (legs, knees, ankles, toes): None, normal, Trunk Movements Neck, shoulders, hips: None, normal, Overall Severity Severity of abnormal movements (highest score from questions above): None, normal Incapacitation due to abnormal movements: None, normal Patient's awareness of abnormal movements (rate only patient's report): No Awareness, Dental Status Current problems with teeth and/or dentures?: No Does patient usually wear dentures?: No  CIWA:  CIWA-Ar Total: 1 COWS:  COWS Total Score: 2  Musculoskeletal: Strength & Muscle Tone: within normal limits Gait & Station: normal Patient leans: N/A  Psychiatric Specialty Exam: Physical Exam  Nursing note and vitals reviewed.   Review of Systems  Constitutional: Negative for chills and fever.  Respiratory: Negative for cough and shortness of breath.   Cardiovascular: Negative for chest pain.  Gastrointestinal: Negative for abdominal pain, heartburn, nausea and vomiting.  Psychiatric/Behavioral: Negative for depression, hallucinations and suicidal ideas. The patient is not nervous/anxious  and does not have insomnia.     Blood pressure 90/76, pulse 93, temperature 98.5 F (36.9 C), resp. rate 20, height 6' (1.829 m), weight 61.2 kg (135 lb).Body mass index is 18.31 kg/m.  General Appearance: Casual and Disheveled  Eye Contact:  Good  Speech:  Garbled and Pressured  Volume:  Normal  Mood:  Euthymic  Affect:  Blunt and Labile  Thought Process:  Coherent and Disorganized  Orientation:  Full (Time, Place, and Person)  Thought Content:  Hallucinations: Auditory and Tangential  Suicidal Thoughts:  No  Homicidal Thoughts:  No  Memory:  Immediate;   Fair Recent;   Fair Remote;   Fair  Judgement:  Fair  Insight:  Lacking  Psychomotor Activity:  Normal  Concentration:  Concentration: Fair  Recall:  [employer] of Knowledge:  Fair  Language:  Fair  Akathisia:  No  Handed:    AIMS (if indicated):     Assets:  Desire for Improvement Resilience  ADL's:  Intact  Cognition:  WNL  Sleep:  Number of Hours: 3.5   Treatment Plan Summary: Daily contact with patient to assess and evaluate symptoms and progress in treatment and Medication management   -Continue inpatient hospitalization.  -Will continue today 12/30/2017 plan as below except where it is noted.  -Schizoaffective disorder, bipolar type -Continue Depakote DR 500mg  qAM + 1000mg  qHs  -Continue Zyprexa 20mg  po qhs  -Agitation -Continue benadryl 25mg  po/IM q6h prn agitation -Continue haldol 5mg  po/IM q6h prn agitation -Continue ativan 1mg  po/IM q6h prn agitation  -insomnia -Continue with Trazodone50mg po qhs prn insomnia (may repeat x1)  -Anxiety - Continue vistaril 25mg  po q8h prn anxiety  -Encourage participation in groups and therapeutic milieu  -Disposition planning will be ongoing  Lindell Spar, NP, PMHNP, FNP-BC 12/30/2017, 12:49 PMPatient ID: Frederick Fletcher, male   DOB: 08/11/1960, 57 y.o.   MRN:  [id number]

## 2017-12-30 NOTE — BHH Group Notes (Signed)
[facility] Group Notes:  (Nursing/MHT/Case Management/Adjunct)  Date:  12/30/2017  Time:  8:59 AM  Type of Therapy:  Orientation and Goals Group  Participation Level:  Minimal  Participation Quality:  Appropriate  Affect:  Appropriate  Cognitive:  Alert  Insight:  Good  Engagement in Group:  Improving  Modes of Intervention:  Discussion and Orientation  Summary of Progress/Problems: His goal for today is to talk to the doctor about going home soon. He feel as though he is ready to do so.   Russie Gulledge J Travia Onstad 12/30/2017, 8:59 AM

## 2017-12-30 NOTE — BHH Group Notes (Signed)
[facility] Group Notes:  (Nursing/MHT/Case Management/Adjunct)  Date:  12/30/2017  Time:  5:41 PM  Type of Therapy:  Psychoeducational Skills  Participation Level:  Active  Participation Quality:  Attentive and Redirectable  Affect:  Appropriate  Cognitive:  Alert and Appropriate  Insight:  Good  Engagement in Group:  Engaged and Supportive  Modes of Intervention:  Discussion and Education  Summary of Progress/Problems:  Discussed crisis management.  Patient was attentive and receptive.  Coralyn Mark Pier Laux 12/30/2017, 5:41 PM

## 2017-12-30 NOTE — Tx Team (Signed)
Interdisciplinary Treatment and Diagnostic Plan Update  12/30/2017 Time of Session: 10:56 AM  Frederick Fletcher MRN: [id number]  Principal Diagnosis: Schizoaffective disorder, bipolar type [facility])  Secondary Diagnoses: Principal Problem:   Schizoaffective disorder, bipolar type ([place])   Current Medications:  Current Facility-Administered Medications  Medication Dose Route Frequency Provider Last Rate Last Dose  . acetaminophen (TYLENOL) tablet 650 mg  650 mg Oral Q6H PRN Money, Lowry Ram, FNP   650 mg at 12/28/17 0751  . alum & mag hydroxide-simeth (MAALOX/MYLANTA) 200-200-20 MG/5ML suspension 30 mL  30 mL Oral Q4H PRN Money, Darnelle Maffucci B, FNP      . diphenhydrAMINE (BENADRYL) capsule 25 mg  25 mg Oral Q6H PRN Money, Lowry Ram, FNP       Or  . diphenhydrAMINE (BENADRYL) injection 25 mg  25 mg Intramuscular Q6H PRN Money, Darnelle Maffucci B, FNP      . divalproex (DEPAKOTE) DR tablet 500 mg  500 mg Oral Rockne Menghini, MD   500 mg at 12/30/17 3536   And  . divalproex (DEPAKOTE) DR tablet 1,000 mg  1,000 mg Oral QHS Pennelope Bracken, MD   1,000 mg at 12/29/17 2107  . famotidine (PEPCID) tablet 40 mg  40 mg Oral QHS Money, Travis B, FNP   40 mg at 12/29/17 2107  . haloperidol (HALDOL) tablet 5 mg  5 mg Oral Q6H PRN Money, Lowry Ram, FNP       Or  . haloperidol lactate (HALDOL) injection 5 mg  5 mg Intramuscular Q6H PRN Money, Lowry Ram, FNP      . hydrOXYzine (ATARAX/VISTARIL) tablet 25 mg  25 mg Oral TID PRN Money, Lowry Ram, FNP   25 mg at 12/28/17 0751  . LORazepam (ATIVAN) tablet 1 mg  1 mg Oral Q6H PRN Money, Lowry Ram, FNP   1 mg at 12/29/17 2354   Or  . LORazepam (ATIVAN) injection 1 mg  1 mg Intramuscular Q6H PRN Money, Darnelle Maffucci B, FNP      . magnesium hydroxide (MILK OF MAGNESIA) suspension 30 mL  30 mL Oral Daily PRN Money, Darnelle Maffucci B, FNP      . nicotine polacrilex (NICORETTE) gum 2 mg  2 mg Oral PRN Derrill Center, NP      . OLANZapine (ZYPREXA) tablet 20 mg  20 mg Oral QHS  Pennelope Bracken, MD   20 mg at 12/29/17 2107  . traZODone (DESYREL) tablet 50 mg  50 mg Oral QHS PRN,MR X 1 [place], Randa Ngo, MD   50 mg at 12/29/17 2354    PTA Medications: Medications Prior to Admission  Medication Sig Dispense Refill Last Dose  . benztropine (COGENTIN) 0.5 MG tablet Take 0.5 mg by mouth 2 (two) times daily.   Past Week at Unknown time  . clonazePAM (KLONOPIN) 0.5 MG tablet Take 0.25 mg by mouth 2 (two) times daily as needed for anxiety.   Past Week at Unknown time  . divalproex (DEPAKOTE ER) 500 MG 24 hr tablet Take 500 mg by mouth 3 (three) times daily.   Past Week at Unknown time  . OLANZapine (ZYPREXA) 20 MG tablet Take 20 mg by mouth 2 (two) times daily.   Past Week at Unknown time  . ranitidine (ZANTAC) 300 MG tablet Take 300 mg by mouth at bedtime as needed for heartburn.   Past Week at Unknown time    Patient Stressors: Medication change or noncompliance  Patient Strengths: Ability for insight Average or above average intelligence General  fund of knowledge  Treatment Modalities: Medication Management, Group therapy, Case management,  1 to 1 session with clinician, Psychoeducation, Recreational therapy.   Physician Treatment Plan for Primary Diagnosis: Schizoaffective disorder, bipolar type ([place]) Long Term Goal(s): Improvement in symptoms so as ready for discharge  Short Term Goals: Ability to identify changes in lifestyle to reduce recurrence of condition will improve Ability to verbalize feelings will improve Ability to demonstrate self-control will improve Compliance with prescribed medications will improve Ability to identify changes in lifestyle to reduce recurrence of condition will improve Ability to identify and develop effective coping behaviors will improve Compliance with prescribed medications will improve  Medication Management: Evaluate patient's response, side effects, and tolerance of medication regimen.  Therapeutic  Interventions: 1 to 1 sessions, Unit Group sessions and Medication administration.  Evaluation of Outcomes: Progressing   6/6:  Frederick Fletcher remains alert, oriented to name, friendly with pressured speech, tangentiality & disorganization of his speech. He presents at times, inappropriate. Pt denies any specific concerns today. He is sleeping well. His appetite is good. He denies SI/HI.  He is tolerating his medications without difficulty or side effects. He is in agreement to continue his current regimen without changes. -Schizoaffective disorder, bipolar type -ContinueDepakote DR 500mg  qAM + 1000mg  qHs  -ContinueZyprexa 20mg  po qhs  -Agitation -Continue benadryl 25mg  po/IM q6h prn agitation -Continue haldol 5mg  po/IM q6h prn agitation -Continue ativan 1mg  po/IM q6h prn agitation      Physician Treatment Plan for Secondary Diagnosis: Principal Problem:   Schizoaffective disorder, bipolar type ([place])   Long Term Goal(s): Improvement in symptoms so as ready for discharge  Short Term Goals: Ability to identify changes in lifestyle to reduce recurrence of condition will improve Ability to verbalize feelings will improve Ability to demonstrate self-control will improve Compliance with prescribed medications will improve Ability to identify changes in lifestyle to reduce recurrence of condition will improve Ability to identify and develop effective coping behaviors will improve Compliance with prescribed medications will improve  Medication Management: Evaluate patient's response, side effects, and tolerance of medication regimen.  Therapeutic Interventions: 1 to 1 sessions, Unit Group sessions and Medication administration.  Evaluation of Outcomes: Progressing   RN Treatment Plan for Primary Diagnosis: Schizoaffective disorder, bipolar type ([place]) Long Term Goal(s): Knowledge of disease and therapeutic regimen to maintain  health will improve  Short Term Goals: Ability to identify and develop effective coping behaviors will improve and Compliance with prescribed medications will improve  Medication Management: RN will administer medications as ordered by provider, will assess and evaluate patient's response and provide education to patient for prescribed medication. RN will report any adverse and/or side effects to prescribing provider.  Therapeutic Interventions: 1 on 1 counseling sessions, Psychoeducation, Medication administration, Evaluate responses to treatment, Monitor vital signs and CBGs as ordered, Perform/monitor CIWA, COWS, AIMS and Fall Risk screenings as ordered, Perform wound care treatments as ordered.  Evaluation of Outcomes: Progressing   LCSW Treatment Plan for Primary Diagnosis: Schizoaffective disorder, bipolar type ([place]) Long Term Goal(s): Safe transition to appropriate next level of care at discharge, Engage patient in therapeutic group addressing interpersonal concerns.  Short Term Goals: Engage patient in aftercare planning with referrals and resources  Therapeutic Interventions: Assess for all discharge needs, 1 to 1 time with Social worker, Explore available resources and support systems, Assess for adequacy in community support network, Educate family and significant other(s) on suicide prevention, Complete Psychosocial Assessment, Interpersonal group therapy.  Evaluation of Outcomes: Progressing  Disposition plan unclear at  this point 6/6:  Ms Kendra Opitz, with whom pt has lived in past, will see tomorrow to see if they can hammer out living araangements  Progress in Treatment: Attending groups: Yes Participating in groups: Yes Taking medication as prescribed: Yes Toleration medication: Yes, no side effects reported at this time Family/Significant other contact made: No Patient understands diagnosis: No Limited insight Discussing patient identified problems/goals with staff:  Yes Medical problems stabilized or resolved: Yes Denies suicidal/homicidal ideation: Yes Issues/concerns per patient self-inventory: None Other: N/A  New problem(s) identified: None identified at this time.   New Short Term/Long Term Goal(s): "I'd like to use my head to better myself.  I see pictures of myself on TV. Lucifer will kill my soul."  Discharge Plan or Barriers:   Reason for Continuation of Hospitalization: Disorganization Medication stabilization   Estimated Length of Stay: 6/11  Attendees: Patient:  12/30/2017  10:56 AM  Physician: Maris Berger, MD 12/30/2017  10:56 AM  Nursing: Elesa Massed, RN 12/30/2017  10:56 AM  RN Care Manager: Lars Pinks, RN 12/30/2017  10:56 AM  Social Worker: Ripley Fraise 12/30/2017  10:56 AM  Recreational Therapist: Winfield Cunas 12/30/2017  10:56 AM  Other: Norberto Sorenson 12/30/2017  10:56 AM  Other:  12/30/2017  10:56 AM    Scribe for Treatment Team:  Roque Lias LCSW 12/30/2017 10:56 AM

## 2017-12-30 NOTE — Progress Notes (Signed)
Recreation Therapy Notes  Date: 6.6.19 Time: 1000 Location: 500 [address] Dayroom  Group Topic: Communication, Team Building, Problem Solving  Goal Area(s) Addresses:  Patient will effectively work with peer towards shared goal.  Patient will identify skill used to make activity successful.  Patient will identify how skills used during activity can be used to reach post d/c goals.   Behavioral Response: Engaged  Intervention: STEM Activity   Activity: Metallurgist. In teams, patients were asked to build the tallest freestanding tower possible out of 15 pipe cleaners. Systematically resources were removed, for example patient ability to use both hands and patient ability to verbally communicate.    Education: Education officer, community, Dentist.   Education Outcome: Acknowledges education/In group clarification offered/Needs additional education.   Clinical Observations/Feedback: Pt worked well with peer.  Pt needed to be redirected at times because he was rambling on about other things.  Pt was able to be somewhat productive when he was able to focus.  Pt was pleasant and active during active.     Victorino Sparrow, LRT/CTRS     Ria Comment, Soraya Paquette A 12/30/2017 11:10 AM

## 2017-12-30 NOTE — Plan of Care (Signed)
Nurse discussed depression, anxiety, coping skills with patient.  

## 2017-12-30 NOTE — Progress Notes (Addendum)
D:  Patient's self inventory sheet, patient sleeps good, sleep medication helpful.  Good appetite, normal energy level, good concentration.  Depression "happy", hopeless and anxiety #5.  Withdrawals checked tremors, sedation, diarrhea, chilling, cravings, cramping, agitation, nausea, runny nose, irritability.  SI, contracts for safety. Physical problems, lightheaded, pain, dizziness, headaches, rash, blurred vision.  Physical pain, worst pain #5 in past 24 hours, back, pain medication helpful.  Goal, plan is to stop and listen.  Does have discharge plans. A:  Medications administered per MD orders.  Emotional support and encouragement given patient. R:  Denied SI and HI while talking to nurse this morning, contracts for safety.  Patient talks and laughs to himself throughout the day, has been seen putting his hand out to shake hands with someone who is not there.  Safety maintained with 15 minute checks.  Patient again denied SI after lunch.

## 2017-12-30 NOTE — BHH Group Notes (Signed)
[facility] Mental Health Association Group Therapy  12/30/2017 , 1:16 PM    Type of Therapy:  Mental Health Association Presentation  Participation Level:  Active  Participation Quality:  Attentive  Affect:  Blunted  Cognitive:  Oriented  Insight:  Limited  Engagement in Therapy:  Engaged  Modes of Intervention:  Discussion, Education and Socialization  Summary of Progress/Problems:  Frederick Fletcher  from [place] came to present her recovery story, encourage group  members to share something about their story, and present information about the MHA.   Shared his story and coping skills, in a disjointed matter, when given opportunity at the end. Frederick Fletcher 12/30/2017 , 1:16 PM

## 2017-12-30 NOTE — Progress Notes (Signed)
Adult Psychoeducational Group Note  Date:  12/30/2017 Time:  8:24 PM  Group Topic/Focus:  Wrap-Up Group:   The focus of this group is to help patients review their daily goal of treatment and discuss progress on daily workbooks.  Participation Level:  Active  Participation Quality:  Appropriate  Affect:  Appropriate  Cognitive:  Disorganized  Insight: Appropriate  Engagement in Group:  Engaged  Modes of Intervention:  Discussion  Additional Comments:  Pt stated that his day was some what good and bad. He is looking forward to being discharged to the group home tomorrow. He said that he is ready for a cigarette and coco-cola.  Wynelle Fanny R 12/30/2017, 8:24 PM

## 2017-12-31 NOTE — [facility] Group Notes (Signed)
[place] LCSW Group Therapy  12/31/2017  1:05 PM  Type of Therapy:  Group therapy  Participation Level:  Active  Participation Quality:  Attentive  Affect:  Flat  Cognitive:  Oriented  Insight:  Limited  Engagement in Therapy:  Limited  Modes of Intervention:  Discussion, Socialization  Summary of Progress/Problems:  Chaplain was here to lead a group on themes of hope and courage. "Hope is people like Frederick Fletcher who used to give me a ride to work and give me good advice.  It's something that can keep me going.  I want enough money to keep from using cocaine and heroin.  It's all hypocrisy."   Trish Mage 12/31/2017 1:26 PM

## 2017-12-31 NOTE — Progress Notes (Signed)
D: Patient denies SI, HI or AVH this evening. Patient presents as anxious and animated but is cooperative.  Pt. Forwards little but states that his day was "ok" and that he is ready to be discharged.  Pt. Denies any physical complaints and reports that is appetite and sleep are good.  Pt. Attended evening wrap up group and was appropriate and attentive.     A: Patient given emotional support from RN. Patient encouraged to come to staff with concerns and/or questions. Patient's medication routine continued. Patient's orders and plan of care reviewed.   R: Patient remains appropriate and cooperative. Will continue to monitor patient q15 minutes for safety.

## 2017-12-31 NOTE — Progress Notes (Signed)
Recreation Therapy Notes    Date: 6.7.19 Time: 1000 Location: 500 Hall Dayroom  Group Topic: Anger Management  Goal Area(s) Addresses:  Patient will identify triggers for anger.  Patient will identify physical reaction to anger.   Patient will identify benefit of using coping skills when angry.  Behavioral Response: Minimal  Intervention:  Worksheet, pencils  Activity: Triggers.  Patients were given a worksheet in which they were to identify their biggest triggers.  Patients were to then describe what strategies they use to avoid or limit their interaction with their trigger and lastly what strategies they use when they can't avoid their triggers.  Education: Anger Management, Discharge Planning   Education Outcome: Acknowledges education/In group clarification offered/Needs additional education.   Clinical Observations/Feedback: Pt needed constant redirection.  Pt had a hard time focusing on the activity.  Pt was able to identify two of his triggers as not understanding himself and people not understanding him.  Pt stated he deals with it by getting in his car and driving.    Victorino Sparrow, LRT/CTRS      Victorino Sparrow A 12/31/2017 12:16 PM

## 2017-12-31 NOTE — Progress Notes (Signed)
[facility] MD Progress Note  12/31/2017 2:54 PM Frederick Fletcher  MRN:  [id number]  Subjective: Frederick Fletcher reports, "I'm doing alright. My mood is getting good, they said. I'm sleeping good. I took my medicine last night, didn't wake up till this morning".   Frederick Fletcher is a 57 y/o M with history of schizophrenia who was admitted from [facility] ED where he presented brought by police after making SI statements about cutting himself to his landlady. He was medically cleared and then transferred to [facility] for additional treatment and evaluation. He was resumed on home medications of zyprexa, klonopin, and depakote. His depakote level was was found to be therapeutic. Pt reports he was staying in a group home in the [place] area but he does not recall the contact information. Pt has remained generally disorganized in his speech and thoughts, and he continues to endorse AH, but he has been denying SI. His dose of zyprexa has been increased during his stay, and his depakote was changed to be only twice per day (same total daily dose). Pt has been generally appropriate and participating on the unit, but he has occasionally been making inappropriate comments as per RN staff, but he is generally able to be redirected.  Today upon evaluation, [name] is seen, chart reviewed. The chart findings discussed with the treatment. He remains alert, oriented to name, friendly, still has pressured speech, tangential & disorganization of his speech. He presents at times, inappropriate, smiles a lot. Pt denies any specific concerns today. He is sleeping well. His appetite is good. He denies SI/HI.  He is tolerating his medications without difficulty or side effects. He is in agreement to continue his current regimen without changes. Pt expressed previously that he would like help with referral to a group home as he has previously lived in one and done well. There is a meeting scheduled today for a group home staff to meet with  Frederick Fletcher. We are awaiting for this meeting to happen. He had no further questions, comments, or concerns. The SW is currently working on the discharge disposition plan.  Principal Problem: Schizoaffective disorder, bipolar type ([place])  Diagnosis:   Patient Active Problem List   Diagnosis Date Noted  . Schizoaffective disorder, bipolar type ([place]) [F25.0] 12/24/2017   Total Time spent with patient: 15 minutes  Past Psychiatric History: See H&P  Past Medical History: History reviewed. No pertinent past medical history. History reviewed. No pertinent surgical history. Family History: History reviewed. No pertinent family history.  Family Psychiatric  History: See H&P  Social History:  Social History   Substance and Sexual Activity  Alcohol Use Not Currently  . Frequency: Never     Social History   Substance and Sexual Activity  Drug Use Not Currently    Social History   Socioeconomic History  . Marital status: Single    Spouse name: Not on file  . Number of children: Not on file  . Years of education: Not on file  . Highest education level: Not on file  Occupational History  . Not on file  Social Needs  . Financial resource strain: Not on file  . Food insecurity:    Worry: Not on file    Inability: Not on file  . Transportation needs:    Medical: Not on file    Non-medical: Not on file  Tobacco Use  . Smoking status: Current Every Day Smoker    Packs/day: 1.00    Types: Cigarettes  . Smokeless tobacco: Never  Used  Substance and Sexual Activity  . Alcohol use: Not Currently    Frequency: Never  . Drug use: Not Currently  . Sexual activity: Not Currently  Lifestyle  . Physical activity:    Days per week: Not on file    Minutes per session: Not on file  . Stress: Not on file  Relationships  . Social connections:    Talks on phone: Not on file    Gets together: Not on file    Attends religious service: Not on file    Active member of club or organization: Not  on file    Attends meetings of clubs or organizations: Not on file    Relationship status: Not on file  Other Topics Concern  . Not on file  Social History Narrative  . Not on file   Additional Social History:   Sleep: Good  Appetite:  Good  Current Medications: Current Facility-Administered Medications  Medication Dose Route Frequency Provider Last Rate Last Dose  . acetaminophen (TYLENOL) tablet 650 mg  650 mg Oral Q6H PRN Money, Lowry Ram, FNP   650 mg at 12/31/17 0752  . alum & mag hydroxide-simeth (MAALOX/MYLANTA) 200-200-20 MG/5ML suspension 30 mL  30 mL Oral Q4H PRN Money, Darnelle Maffucci B, FNP      . diphenhydrAMINE (BENADRYL) capsule 25 mg  25 mg Oral Q6H PRN Money, Lowry Ram, FNP       Or  . diphenhydrAMINE (BENADRYL) injection 25 mg  25 mg Intramuscular Q6H PRN Money, Darnelle Maffucci B, FNP      . divalproex (DEPAKOTE) DR tablet 500 mg  500 mg Oral Tana Conch T, MD   500 mg at 12/31/17 0750   And  . divalproex (DEPAKOTE) DR tablet 1,000 mg  1,000 mg Oral QHS Pennelope Bracken, MD   1,000 mg at 12/30/17 2156  . famotidine (PEPCID) tablet 40 mg  40 mg Oral QHS Money, Travis B, FNP   40 mg at 12/30/17 2155  . haloperidol (HALDOL) tablet 5 mg  5 mg Oral Q6H PRN Money, Lowry Ram, FNP       Or  . haloperidol lactate (HALDOL) injection 5 mg  5 mg Intramuscular Q6H PRN Money, Lowry Ram, FNP      . hydrOXYzine (ATARAX/VISTARIL) tablet 25 mg  25 mg Oral TID PRN Money, Lowry Ram, FNP   25 mg at 12/28/17 0751  . LORazepam (ATIVAN) tablet 1 mg  1 mg Oral Q6H PRN Money, Lowry Ram, FNP   1 mg at 12/29/17 2354   Or  . LORazepam (ATIVAN) injection 1 mg  1 mg Intramuscular Q6H PRN Money, Darnelle Maffucci B, FNP      . magnesium hydroxide (MILK OF MAGNESIA) suspension 30 mL  30 mL Oral Daily PRN Money, Darnelle Maffucci B, FNP      . nicotine polacrilex (NICORETTE) gum 2 mg  2 mg Oral PRN Derrill Center, NP      . OLANZapine (ZYPREXA) tablet 20 mg  20 mg Oral QHS Pennelope Bracken, MD   20 mg at  12/30/17 2156  . traZODone (DESYREL) tablet 50 mg  50 mg Oral QHS PRN,MR X 1 [place], Randa Ngo, MD   50 mg at 12/29/17 2354   Lab Results: No results found for this or any previous visit (from the past 48 hour(s)).  Blood Alcohol level:  Lab Results  Component Value Date   ETH <10 54/65/0354    Metabolic Disorder Labs: Lab Results  Component Value Date  HGBA1C 6.5 (H) 12/26/2017   MPG 139.85 12/26/2017   MPG 131.24 12/25/2017   Lab Results  Component Value Date   PROLACTIN 50.5 (H) 12/25/2017   Lab Results  Component Value Date   CHOL 146 12/25/2017   TRIG 64 12/25/2017   HDL 50 12/25/2017   CHOLHDL 2.9 12/25/2017   VLDL 13 12/25/2017   LDLCALC 83 12/25/2017   LDLCALC  01/20/2008    68        Total Cholesterol/HDL:CHD Risk Coronary Heart Disease Risk Table                     Men   Women  1/2 Average Risk   3.4   3.3   Physical Findings: AIMS: Facial and Oral Movements Muscles of Facial Expression: None, normal Lips and Perioral Area: None, normal Jaw: None, normal Tongue: None, normal,Extremity Movements Upper (arms, wrists, hands, fingers): None, normal Lower (legs, knees, ankles, toes): None, normal, Trunk Movements Neck, shoulders, hips: None, normal, Overall Severity Severity of abnormal movements (highest score from questions above): None, normal Incapacitation due to abnormal movements: None, normal Patient's awareness of abnormal movements (rate only patient's report): No Awareness, Dental Status Current problems with teeth and/or dentures?: No Does patient usually wear dentures?: No  CIWA:  CIWA-Ar Total: 1 COWS:  COWS Total Score: 2  Musculoskeletal: Strength & Muscle Tone: within normal limits Gait & Station: normal Patient leans: N/A  Psychiatric Specialty Exam: Physical Exam  Nursing note and vitals reviewed.   Review of Systems  Constitutional: Negative for chills and fever.  Respiratory: Negative for cough and shortness of  breath.   Cardiovascular: Negative for chest pain.  Gastrointestinal: Negative for abdominal pain, heartburn, nausea and vomiting.  Psychiatric/Behavioral: Negative for depression, hallucinations and suicidal ideas. The patient is not nervous/anxious and does not have insomnia.     Blood pressure 98/77, pulse 86, temperature 97.8 F (36.6 C), resp. rate 20, height 6' (1.829 m), weight 61.2 kg (135 lb).Body mass index is 18.31 kg/m.  General Appearance: Casual and Disheveled  Eye Contact:  Good  Speech:  Garbled and Pressured  Volume:  Normal  Mood:  Euthymic  Affect:  Blunt and Labile  Thought Process:  Coherent and Disorganized  Orientation:  Full (Time, Place, and Person)  Thought Content:  Hallucinations: Auditory and Tangential  Suicidal Thoughts:  No  Homicidal Thoughts:  No  Memory:  Immediate;   Fair Recent;   Fair Remote;   Fair  Judgement:  Fair  Insight:  Lacking  Psychomotor Activity:  Normal  Concentration:  Concentration: Fair  Recall:  [employer] of Knowledge:  Fair  Language:  Fair  Akathisia:  No  Handed:    AIMS (if indicated):     Assets:  Desire for Improvement Resilience  ADL's:  Intact  Cognition:  WNL  Sleep:  Number of Hours: 5   Treatment Plan Summary: Daily contact with patient to assess and evaluate symptoms and progress in treatment and Medication management   -Continue inpatient hospitalization.  -Will continue today 12/31/2017 plan as below except where it is noted.  -Schizoaffective disorder, bipolar type -Continue Depakote DR 500mg  qAM + 1000mg  qHs  -Continue Zyprexa 20mg  po qhs  -Agitation -Continue benadryl 25mg  po/IM q6h prn agitation -Continue haldol 5mg  po/IM q6h prn agitation -Continue ativan 1mg  po/IM q6h prn agitation  -insomnia -Continue with Trazodone50mg po qhs prn insomnia (may repeat x1)  -Anxiety - Continue vistaril 25mg  po q8h  prn anxiety  -  Encourage participation in groups and therapeutic milieu  -Disposition planning will be ongoing  Lindell Spar, NP, PMHNP, FNP-BC 12/31/2017, 2:54 PMPatient ID: Frederick Fletcher, male   DOB: 07/06/1961, 57 y.o.   MRN: [id number]

## 2017-12-31 NOTE — Progress Notes (Signed)
Adult Psychoeducational Group Note  Date:  12/31/2017 Time:  8:47 PM  Group Topic/Focus:  Wrap-Up Group:   The focus of this group is to help patients review their daily goal of treatment and discuss progress on daily workbooks.  Participation Level:  Active  Participation Quality:  Appropriate  Affect:  Appropriate  Cognitive:  Appropriate  Insight: Appropriate  Engagement in Group:  Engaged  Modes of Intervention:  Discussion  Additional Comments: The patient expressed that he rates today a 10.The patient also said that he goal is to prepare for Discharge.  Nash Shearer 12/31/2017, 8:47 PM

## 2017-12-31 NOTE — Plan of Care (Signed)
  Problem: Coping: Goal: Ability to verbalize frustrations and anger appropriately will improve Outcome: Progressing   Problem: Safety: Goal: Periods of time without injury will increase Outcome: Progressing   DAR NOTE: Patient presents with anxious affect and labile mood.  Pt has been observed in the dayroom with peers interacting. Pt has a loud personality, but pleasant. Pt reports good sleep,good appetite, high energy, and good concentration. Denies pain, auditory and visual hallucinations.  Rates depression at 0, hopelessness at 0, and anxiety at 5.  Maintained on routine safety checks.  Medications given as prescribed.  Support and encouragement offered as needed.  Attended group and participated.  Patient observed socializing with peers in the dayroom.  Offered no complaint.

## 2018-01-01 NOTE — Progress Notes (Signed)
[facility] MD Progress Note  01/01/2018 10:17 AM Frederick Fletcher  MRN:  [id number]  Subjective: Frederick Fletcher reports, "I'm doing great, mama!"  Frederick Fletcher is a 57 y/o M with history of schizophrenia who was admitted from [facility] ED where he presented brought by police after making SI statements about cutting himself to his landlady. He was medically cleared and then transferred to [facility] for additional treatment and evaluation. He was resumed on home medications of zyprexa, klonopin, and depakote. His depakote level was was found to be therapeutic. Pt reports he was staying in a group home in the [place] area but he does not recall the contact information. Pt has remained generally disorganized in his speech and thoughts, and he continues to endorse AH, but he has been denying SI. His dose of zyprexa has been increased during his stay, and his depakote was changed to be only twice per day (same total daily dose). Pt has been generally appropriate and participating on the unit, but he has occasionally been making inappropriate comments as per RN staff, but he is generally able to be redirected.  Today upon evaluation, Frederick Fletcher presents, very excited & loud. He says, "I'm doing great, mama".  His chart is reviewed. The chart findings discussed with the treatment team. He remains alert, oriented to name, super friendly. He continues to have pressured speech, tangential & disorganized speech. He presents at times, inappropriate, smiles & jokes a lot. Pt denies any specific concerns today. He is sleeping well. His appetite is good. He denies SI/HI.  He is tolerating his medications without difficulty or side effects. He is in agreement to continue his current regimen without changes. Pt expressed previously that he would like help with referral to a group home as he has previously lived in one and done well. There was a meeting scheduled yesterday for a group home staff to meet with Frederick Fletcher for placement. The  nursing staff reports that this meeting did happen yesterday evening. We are waiting for the group home staff response. At this time, patient remains visible on the unit. He is attending group sessions with the rest of the patients. He does not appear to be in no apparent distress. He had no further questions, comments, or concerns. The SW is currently working on the discharge disposition plan.  Principal Problem: Schizoaffective disorder, bipolar type ([place])  Diagnosis:   Patient Active Problem List   Diagnosis Date Noted  . Schizoaffective disorder, bipolar type ([place]) [F25.0] 12/24/2017   Total Time spent with patient: 15 minutes  Past Psychiatric History: See H&P  Past Medical History: History reviewed. No pertinent past medical history. History reviewed. No pertinent surgical history. Family History: History reviewed. No pertinent family history.  Family Psychiatric  History: See H&P  Social History:  Social History   Substance and Sexual Activity  Alcohol Use Not Currently  . Frequency: Never     Social History   Substance and Sexual Activity  Drug Use Not Currently    Social History   Socioeconomic History  . Marital status: Single    Spouse name: Not on file  . Number of children: Not on file  . Years of education: Not on file  . Highest education level: Not on file  Occupational History  . Not on file  Social Needs  . Financial resource strain: Not on file  . Food insecurity:    Worry: Not on file    Inability: Not on file  . Transportation needs:  Medical: Not on file    Non-medical: Not on file  Tobacco Use  . Smoking status: Current Every Day Smoker    Packs/day: 1.00    Types: Cigarettes  . Smokeless tobacco: Never Used  Substance and Sexual Activity  . Alcohol use: Not Currently    Frequency: Never  . Drug use: Not Currently  . Sexual activity: Not Currently  Lifestyle  . Physical activity:    Days per week: Not on file    Minutes per  session: Not on file  . Stress: Not on file  Relationships  . Social connections:    Talks on phone: Not on file    Gets together: Not on file    Attends religious service: Not on file    Active member of club or organization: Not on file    Attends meetings of clubs or organizations: Not on file    Relationship status: Not on file  Other Topics Concern  . Not on file  Social History Narrative  . Not on file   Additional Social History:   Sleep: Good  Appetite:  Good  Current Medications: Current Facility-Administered Medications  Medication Dose Route Frequency Provider Last Rate Last Dose  . acetaminophen (TYLENOL) tablet 650 mg  650 mg Oral Q6H PRN Money, Lowry Ram, FNP   650 mg at 01/01/18 0749  . alum & mag hydroxide-simeth (MAALOX/MYLANTA) 200-200-20 MG/5ML suspension 30 mL  30 mL Oral Q4H PRN Money, Darnelle Maffucci B, FNP      . diphenhydrAMINE (BENADRYL) capsule 25 mg  25 mg Oral Q6H PRN Money, Lowry Ram, FNP       Or  . diphenhydrAMINE (BENADRYL) injection 25 mg  25 mg Intramuscular Q6H PRN Money, Darnelle Maffucci B, FNP      . divalproex (DEPAKOTE) DR tablet 500 mg  500 mg Oral Tana Conch T, MD   500 mg at 01/01/18 9629   And  . divalproex (DEPAKOTE) DR tablet 1,000 mg  1,000 mg Oral QHS Maris Berger T, MD   1,000 mg at 12/31/17 2126  . famotidine (PEPCID) tablet 40 mg  40 mg Oral QHS Money, Travis B, FNP   40 mg at 12/31/17 2126  . haloperidol (HALDOL) tablet 5 mg  5 mg Oral Q6H PRN Money, Lowry Ram, FNP       Or  . haloperidol lactate (HALDOL) injection 5 mg  5 mg Intramuscular Q6H PRN Money, Lowry Ram, FNP      . hydrOXYzine (ATARAX/VISTARIL) tablet 25 mg  25 mg Oral TID PRN Money, Lowry Ram, FNP   25 mg at 12/28/17 0751  . LORazepam (ATIVAN) tablet 1 mg  1 mg Oral Q6H PRN Money, Lowry Ram, FNP   1 mg at 12/29/17 2354   Or  . LORazepam (ATIVAN) injection 1 mg  1 mg Intramuscular Q6H PRN Money, Darnelle Maffucci B, FNP      . magnesium hydroxide (MILK OF MAGNESIA)  suspension 30 mL  30 mL Oral Daily PRN Money, Darnelle Maffucci B, FNP      . nicotine polacrilex (NICORETTE) gum 2 mg  2 mg Oral PRN [facility], NP      . OLANZapine (ZYPREXA) tablet 20 mg  20 mg Oral QHS Pennelope Bracken, MD   20 mg at 12/31/17 2126  . traZODone (DESYREL) tablet 50 mg  50 mg Oral QHS PRN,MR X 1 Pennelope Bracken, MD   50 mg at 12/31/17 2126   Lab Results: No results found for this or  any previous visit (from the past 48 hour(s)).  Blood Alcohol level:  Lab Results  Component Value Date   ETH <10 47/82/9562    Metabolic Disorder Labs: Lab Results  Component Value Date   HGBA1C 6.5 (H) 12/26/2017   MPG 139.85 12/26/2017   MPG 131.24 12/25/2017   Lab Results  Component Value Date   PROLACTIN 50.5 (H) 12/25/2017   Lab Results  Component Value Date   CHOL 146 12/25/2017   TRIG 64 12/25/2017   HDL 50 12/25/2017   CHOLHDL 2.9 12/25/2017   VLDL 13 12/25/2017   LDLCALC 83 12/25/2017   LDLCALC  01/20/2008    68        Total Cholesterol/HDL:CHD Risk Coronary Heart Disease Risk Table                     Men   Women  1/2 Average Risk   3.4   3.3   Physical Findings: AIMS: Facial and Oral Movements Muscles of Facial Expression: None, normal Lips and Perioral Area: None, normal Jaw: None, normal Tongue: None, normal,Extremity Movements Upper (arms, wrists, hands, fingers): None, normal Lower (legs, knees, ankles, toes): None, normal, Trunk Movements Neck, shoulders, hips: None, normal, Overall Severity Severity of abnormal movements (highest score from questions above): None, normal Incapacitation due to abnormal movements: None, normal Patient's awareness of abnormal movements (rate only patient's report): No Awareness, Dental Status Current problems with teeth and/or dentures?: No Does patient usually wear dentures?: No  CIWA:  CIWA-Ar Total: 1 COWS:  COWS Total Score: 2  Musculoskeletal: Strength & Muscle Tone: within normal limits Gait &  Station: normal Patient leans: N/A  Psychiatric Specialty Exam: Physical Exam  Nursing note and vitals reviewed.   Review of Systems  Constitutional: Negative for chills and fever.  Respiratory: Negative for cough and shortness of breath.   Cardiovascular: Negative for chest pain.  Gastrointestinal: Negative for abdominal pain, heartburn, nausea and vomiting.  Psychiatric/Behavioral: Negative for depression, hallucinations and suicidal ideas. The patient is not nervous/anxious and does not have insomnia.     Blood pressure 114/79, pulse 77, temperature 97.9 F (36.6 C), resp. rate 20, height 6' (1.829 m), weight 61.2 kg (135 lb).Body mass index is 18.31 kg/m.  General Appearance: Casual and Disheveled  Eye Contact:  Good  Speech:  Garbled and Pressured  Volume:  Normal  Mood:  Euthymic  Affect:  Blunt and Labile  Thought Process:  Coherent and Disorganized  Orientation:  Full (Time, Place, and Person)  Thought Content:  Hallucinations: Auditory and Tangential  Suicidal Thoughts:  No  Homicidal Thoughts:  No  Memory:  Immediate;   Fair Recent;   Fair Remote;   Fair  Judgement:  Fair  Insight:  Lacking  Psychomotor Activity:  Normal  Concentration:  Concentration: Fair  Recall:  [employer] of Knowledge:  Fair  Language:  Fair  Akathisia:  No  Handed:    AIMS (if indicated):     Assets:  Desire for Improvement Resilience  ADL's:  Intact  Cognition:  WNL  Sleep:  Number of Hours: 6   Treatment Plan Summary: Daily contact with patient to assess and evaluate symptoms and progress in treatment and Medication management   -Continue inpatient hospitalization.  -Will continue today 01/01/2018 plan as below except where it is noted.  -Schizoaffective disorder, bipolar type -Continue Depakote DR 500 mg qAM + 1000mg  Q hs, (level drawn on 12-25-17, result, therapeutic at 76).  -Continue Zyprexa 20  mg po Q hs  -Agitation -Continue  benadryl 25mg  po/IM q6h prn agitation-Continue haldol 5mg  po/IM q6h prn agitation -Continue ativan 1mg  po/IM q6h prn agitation  -insomnia -Continue with Trazodone50mg po qhs prn insomnia (may repeat x1)  -Anxiety - Continue vistaril 25mg  po q8h prn anxiety  -Encourage participation in groups and therapeutic milieu  -Disposition planning will be ongoing  Lindell Spar, NP, PMHNP, FNP-BC 01/01/2018, 10:17 AMPatient ID: Frederick Fletcher, male   DOB: 1960-10-27, 57 y.o.   MRN: [id number]

## 2018-01-01 NOTE — Progress Notes (Signed)
Adult Psychoeducational Group Note  Date:  01/01/2018 Time:  8:23 PM  Group Topic/Focus:  Wrap-Up Group:   The focus of this group is to help patients review their daily goal of treatment and discuss progress on daily workbooks.  Participation Level:  Active  Participation Quality:  Appropriate  Affect:  Appropriate  Cognitive:  Appropriate  Insight: Appropriate  Engagement in Group:  Engaged  Modes of Intervention:  Discussion  Additional Comments:  The patient expressed that he attended group.The patient also said that he rates today a 10.  Nash Shearer 01/01/2018, 8:23 PM

## 2018-01-01 NOTE — [facility] Group Notes (Signed)
  [facility] LCSW Group Therapy Note  Date/Time:  01/01/2018 11:15AM-12:00PM  Type of Therapy and Topic:  Group Therapy:  Feelings About Hospitalization  Participation Level:  Active   Description of Group This process group involved patients discussing their feelings related to being hospitalized, as well as the benefits they see to being in the hospital.  These feelings and benefits were itemized.  The group then brainstormed specific ways in which they could seek those same benefits when they discharge and return home.  Therapeutic Goals 1. Patient will identify and describe positive and negative feelings related to hospitalization 2. Patient will verbalize benefits of hospitalization to themselves personally 3. Patients will brainstorm together ways they can obtain similar benefits in the outpatient setting, identify barriers to wellness and possible solutions  Summary of Patient Progress:  The patient expressed himself frequently throughout group, with some comments being initially related to the topic, then very rapidly veering into flight of ideas.  He was very pleasant and laughed a great deal.  He stated at one point "I'd stay here forever" when CSW was attempting to lead a discussion about how to stay well and out of the hospital when discharged.  During the last 15 minutes of group he spoke quietly to himself, but out loud.  Therapeutic Modalities Cognitive Behavioral Therapy Motivational Interviewing    Selmer Dominion, LCSW 01/01/2018, 1:53 PM

## 2018-01-01 NOTE — Progress Notes (Signed)
Patient ID: Frederick Fletcher, male   DOB: [birth year], 57 y.o.   MRN: [id number] Per State regulations 482.30 this chart was reviewed for medical necessity with respect to the patient's admission/duration of stay.    Next review date: 01/05/18  Debarah Crape, BSN, RN-BC  Case Manager

## 2018-01-01 NOTE — Plan of Care (Signed)
Pt progressing in the following metrics. Pt describes having sternal pain that is ongoing, 10/10. Pt provided medication. Will continue to monitor. Pt states this is ongoing. Pt compliant with medications. Pt denies any si/hi/ah/vh and verbally agrees to approach staff if these become apparent. Pt provided support and encouragement. Pt in the dayroom interacting now. Q65m checks implemented and continued. Pt safe on the unit.  Problem: Education: Goal: Emotional status will improve Outcome: Progressing   Problem: Activity: Goal: Interest or engagement in activities will improve Outcome: Progressing Goal: Sleeping patterns will improve Outcome: Progressing   Problem: Coping: Goal: Ability to verbalize frustrations and anger appropriately will improve Outcome: Progressing Goal: Ability to demonstrate self-control will improve Outcome: Progressing   Problem: Health Behavior/Discharge Planning: Goal: Compliance with treatment plan for underlying cause of condition will improve Outcome: Progressing   Problem: Safety: Goal: Periods of time without injury will increase Outcome: Progressing   Problem: Medication: Goal: Compliance with prescribed medication regimen will improve Outcome: Progressing   Problem: Self-Concept: Goal: Ability to disclose and discuss suicidal ideas will improve Outcome: Progressing   Problem: Activity: Goal: Interest or engagement in leisure activities will improve Outcome: Progressing Goal: Imbalance in normal sleep/wake cycle will improve Outcome: Progressing   Problem: Coping: Goal: Will verbalize feelings Outcome: Progressing   Problem: Health Behavior/Discharge Planning: Goal: Ability to make decisions will improve Outcome: Progressing Goal: Compliance with therapeutic regimen will improve Outcome: Progressing   Problem: Safety: Goal: Ability to disclose and discuss suicidal ideas will improve Outcome: Progressing   Problem:  Education: Goal: Knowledge of the prescribed therapeutic regimen will improve Outcome: Progressing   Problem: Nutritional: Goal: Ability to achieve adequate nutritional intake will improve Outcome: Progressing   Problem: Safety: Goal: Ability to remain free from injury will improve Outcome: Progressing

## 2018-01-01 NOTE — Progress Notes (Signed)
Patient ID: Frederick Fletcher, male   DOB: [birth year], 57 y.o.   MRN: [id number] DAR Note: Pt is confused, disorganized and speech is tangential; when asked if Pt was anxious or depressed, he states, "you know what I know." Pt observed laughing inappropriately as if reacting to some internal stimuli denies AVH. Pt also denied depression, anxiety, pain, SI, or HI. Pt was med compliant. All patient's questions and concerns addressed. Support, encouragement, and safe environment provided. Pt was isolative and withdrawn to her room; remained in bed with eyes closed. Pt attend wrap-up group.

## 2018-01-01 NOTE — [facility] Group Notes (Signed)
[facility] Group Notes:  (Nursing/MHT/Case Management/Adjunct)  Date:  01/01/2018  Time:  4:53 PM  Type of Therapy:  Psychoeducational Skills  Participation Level:  Active  Participation Quality:  Attentive  Affect:  Appropriate  Cognitive:  Alert and Appropriate  Insight:  Good  Engagement in Group:  None  Modes of Intervention:  Activity  Summary of Progress/Problems:  Patient attended group and was attentive.  Coralyn Mark Jacqulyn Barresi 01/01/2018, 4:53 PM

## 2018-01-01 NOTE — Progress Notes (Signed)
D: patient presented to the nurses station complaining of "chest bone pain". Pt stated this is ongoing. Pt denies any arm pain/other physical pain. Pt denies any si/hi/ah/vh and verbally agrees to approach staff if these become apparent. Pt has no complaints at this time. Pt rates his "chest bone" pain a 10/10. Pt compliant with medication administration.  A: pt given support and encouragement. Pt provided medications per protocol and standing orders. q77m safety checks implemented and continued. R: pt socializing in the dayroom. Pt safe on the unit. Will continue to monitor.

## 2018-01-02 NOTE — [facility] Group Notes (Addendum)
[facility] LCSW Group Therapy Note  Date/Time:  01/02/2018  11:00AM-12:00PM  Type of Therapy and Topic:  Group Therapy:  Music and Mood  Participation Level:  Active   Description of Group: In this process group, members listened to a variety of genres of music and identified that different types of music evoke different responses.  Patients were encouraged to identify music that was soothing for them and music that was energizing for them.  Patients discussed how this knowledge can help with wellness and recovery in various ways including managing depression and anxiety as well as encouraging healthy sleep habits.    Therapeutic Goals: 1. Patients will explore the impact of different varieties of music on mood 2. Patients will verbalize the thoughts they have when listening to different types of music 3. Patients will identify music that is soothing to them as well as music that is energizing to them 4. Patients will discuss how to use this knowledge to assist in maintaining wellness and recovery 5. Patients will explore the use of music as a coping skill  Summary of Patient Progress:  At the beginning of group, patient expressed that he always feels good, and at the end of group he stated he felt "awesome."  He had a lot of self-talking throughout group and some odd gestures and facial expression directed to CSW, but was not disruptive.  Therapeutic Modalities: Solution Focused Brief Therapy Activity   Selmer Dominion, LCSW

## 2018-01-02 NOTE — Progress Notes (Signed)
D: pt found in bed; compliant with medication administration. Pt complains of a headache. Pt rates his depression/hopelessness/anxiety all 5/10. Pt doesn't have a goal as of now. Pt stated he slept well last night. Pt has disorganized speech which is very pressured and rapid. Pt denies any si/hi/ah/vh and verbally agrees to approach staff if these become apparent.  A: pt given support and encouragement. Pt provided medications per protocol and standing orders. q55m safety checks implemented and continued.  R: pt safe on the unit. Pt returned to bed to rest. Will continue to monitor.

## 2018-01-02 NOTE — B[facility] Group Notes (Signed)
[place] Group Notes:  (Nursing/MHT/Case Management/Adjunct)  Date:  01/02/2018  Time:  0915  Type of Therapy:  Psychoeducational Skills  Participation Level:  Did Not Attend  Participation Quality:    Affect:   Cognitive:    Insight:    Engagement in Group:    Modes of Intervention:    Summary of Progress/Problems:  Frederick Fletcher 01/02/2018, 12:05 PM

## 2018-01-02 NOTE — Progress Notes (Signed)
Patient ID: Frederick Fletcher, male   DOB: 26-Nov-1960, 57 y.o.   MRN: [id number] DAR Note: Pt continue to be very confused, disorganized with tangential speech; "I put all my check in one place then return them back to the popo." Pt observed laughing inappropriately denies AVH. Pt also denied depression, anxiety, pain, SI, or HI. Pt was med compliant. All patient's questions and concerns addressed. Support, encouragement, and safe environment provided. Pt was isolative and withdrawn to her room; remained in bed with eyes closed. Pt attend wrap-up group.

## 2018-01-02 NOTE — Plan of Care (Signed)
Pt progressing in the following metrics. Pt complains of a headache this morning. Pt rates his depression/hopelessness/anxiety all 5/10. Pt doesn't have a goal this morning. Pt compliant with medication administration. Pt denies any si/hi/ah/vh and verbally agrees to approach staff if these become apparent. Q74m checks implemented and continued. Pt safe on the unit resting in bed. Will continue to monitor.  Problem: Activity: Goal: Interest or engagement in activities will improve Outcome: Progressing Goal: Sleeping patterns will improve Outcome: Progressing   Problem: Coping: Goal: Ability to verbalize frustrations and anger appropriately will improve Outcome: Progressing   Problem: Health Behavior/Discharge Planning: Goal: Compliance with treatment plan for underlying cause of condition will improve Outcome: Progressing   Problem: Physical Regulation: Goal: Ability to maintain clinical measurements within normal limits will improve Outcome: Progressing   Problem: Safety: Goal: Periods of time without injury will increase Outcome: Progressing   Problem: Education: Goal: Ability to make informed decisions regarding treatment will improve Outcome: Progressing   Problem: Medication: Goal: Compliance with prescribed medication regimen will improve Outcome: Progressing   Problem: Self-Concept: Goal: Ability to disclose and discuss suicidal ideas will improve Outcome: Progressing   Problem: Education: Goal: Knowledge of the prescribed therapeutic regimen will improve Outcome: Progressing   Problem: Activity: Goal: Interest or engagement in leisure activities will improve Outcome: Progressing Goal: Imbalance in normal sleep/wake cycle will improve Outcome: Progressing   Problem: Coping: Goal: Will verbalize feelings Outcome: Progressing   Problem: Safety: Goal: Ability to disclose and discuss suicidal ideas will improve Outcome: Progressing   Problem: Health  Behavior/Discharge Planning: Goal: Compliance with prescribed medication regimen will improve Outcome: Progressing   Problem: Nutritional: Goal: Ability to achieve adequate nutritional intake will improve Outcome: Progressing   Problem: Role Relationship: Goal: Ability to interact with others will improve Outcome: Progressing   Problem: Safety: Goal: Ability to remain free from injury will improve Outcome: Progressing   Problem: Safety: Goal: Ability to remain free from injury will improve Outcome: Progressing

## 2018-01-02 NOTE — Progress Notes (Signed)
[facility] MD Progress Note  01/02/2018 10:44 AM Frederick Fletcher  MRN:  [id number]  Subjective: Frederick Fletcher reports, "I'm living good, eating good, sleeping good, using the bathroom good. I'm taking the medications. I'm doing good".  Frederick Fletcher is a 57 y/o M with history of schizophrenia who was admitted from [facility] ED where he presented brought by police after making SI statements about cutting himself to his landlady. He was medically cleared and then transferred to [facility] for additional treatment and evaluation. He was resumed on home medications of zyprexa, klonopin, and depakote. His depakote level was was found to be therapeutic. Pt reports he was staying in a group home in the [place] area but he does not recall the contact information. Pt has remained generally disorganized in his speech and thoughts, and he continues to endorse AH, but he has been denying SI. His dose of zyprexa has been increased during his stay, and his depakote was changed to be only twice per day (same total daily dose). Pt has been generally appropriate and participating on the unit, but he has occasionally been making inappropriate comments as per RN staff, but he is generally able to be redirected.  Today upon evaluation, Frederick Fletcher says,"I'm living good, eating good, sleeping good, using the bathroom good. I'm taking the medications. I'm doing good".  His chart is reviewed. The chart findings discussed with the treatment team. He remains alert, oriented to name, super friendly. He continues to have pressured speech, tangential & disorganized speech. He presents at times, inappropriate, smiles & jokes a lot. Pt denies any specific concerns today. He is sleeping well. His appetite is good. He denies SI/HI.  He is tolerating his medications without difficulty or side effects. He is in agreement to continue his current treatment regimen without changes. Pt had expressed previously that he would like help with referral to a group  home as he has previously lived in one and done well. There was a meeting scheduled 2 days ago for a group home staff to meet with Frederick Fletcher for placement. The nursing staff reports that this meeting did happen as scheduled. We are waiting for the group home staff response. At this time, patient remains visible on the unit. He is attending group sessions with the rest of the patients. He does not appear to be in any apparent distress. He had no further questions, comments, or concerns. The SW is currently working on the discharge disposition plan.  Principal Problem: Schizoaffective disorder, bipolar type ([place])  Diagnosis:   Patient Active Problem List   Diagnosis Date Noted  . Schizoaffective disorder, bipolar type ([place]) [F25.0] 12/24/2017   Total Time spent with patient: 15 minutes  Past Psychiatric History: See H&P  Past Medical History: History reviewed. No pertinent past medical history. History reviewed. No pertinent surgical history. Family History: History reviewed. No pertinent family history.  Family Psychiatric  History: See H&P  Social History:  Social History   Substance and Sexual Activity  Alcohol Use Not Currently  . Frequency: Never     Social History   Substance and Sexual Activity  Drug Use Not Currently    Social History   Socioeconomic History  . Marital status: Single    Spouse name: Not on file  . Number of children: Not on file  . Years of education: Not on file  . Highest education level: Not on file  Occupational History  . Not on file  Social Needs  . Financial resource strain: Not on  file  . Food insecurity:    Worry: Not on file    Inability: Not on file  . Transportation needs:    Medical: Not on file    Non-medical: Not on file  Tobacco Use  . Smoking status: Current Every Day Smoker    Packs/day: 1.00    Types: Cigarettes  . Smokeless tobacco: Never Used  Substance and Sexual Activity  . Alcohol use: Not Currently    Frequency: Never   . Drug use: Not Currently  . Sexual activity: Not Currently  Lifestyle  . Physical activity:    Days per week: Not on file    Minutes per session: Not on file  . Stress: Not on file  Relationships  . Social connections:    Talks on phone: Not on file    Gets together: Not on file    Attends religious service: Not on file    Active member of club or organization: Not on file    Attends meetings of clubs or organizations: Not on file    Relationship status: Not on file  Other Topics Concern  . Not on file  Social History Narrative  . Not on file   Additional Social History:   Sleep: Good  Appetite:  Good  Current Medications: Current Facility-Administered Medications  Medication Dose Route Frequency Provider Last Rate Last Dose  . acetaminophen (TYLENOL) tablet 650 mg  650 mg Oral Q6H PRN Money, Lowry Ram, FNP   650 mg at 01/02/18 0737  . alum & mag hydroxide-simeth (MAALOX/MYLANTA) 200-200-20 MG/5ML suspension 30 mL  30 mL Oral Q4H PRN Money, Darnelle Maffucci B, FNP      . diphenhydrAMINE (BENADRYL) capsule 25 mg  25 mg Oral Q6H PRN Money, Lowry Ram, FNP       Or  . diphenhydrAMINE (BENADRYL) injection 25 mg  25 mg Intramuscular Q6H PRN Money, Darnelle Maffucci B, FNP      . divalproex (DEPAKOTE) DR tablet 500 mg  500 mg Oral Rockne Menghini, MD   500 mg at 01/02/18 1025   And  . divalproex (DEPAKOTE) DR tablet 1,000 mg  1,000 mg Oral QHS Pennelope Bracken, MD   1,000 mg at 01/01/18 2106  . famotidine (PEPCID) tablet 40 mg  40 mg Oral QHS Money, Travis B, FNP   40 mg at 01/01/18 2105  . haloperidol (HALDOL) tablet 5 mg  5 mg Oral Q6H PRN Money, Lowry Ram, FNP       Or  . haloperidol lactate (HALDOL) injection 5 mg  5 mg Intramuscular Q6H PRN Money, Lowry Ram, FNP      . hydrOXYzine (ATARAX/VISTARIL) tablet 25 mg  25 mg Oral TID PRN Money, Lowry Ram, FNP   25 mg at 01/01/18 2321  . LORazepam (ATIVAN) tablet 1 mg  1 mg Oral Q6H PRN Money, Lowry Ram, FNP   1 mg at 12/29/17 2354    Or  . LORazepam (ATIVAN) injection 1 mg  1 mg Intramuscular Q6H PRN Money, Darnelle Maffucci B, FNP      . magnesium hydroxide (MILK OF MAGNESIA) suspension 30 mL  30 mL Oral Daily PRN Money, Darnelle Maffucci B, FNP      . nicotine polacrilex (NICORETTE) gum 2 mg  2 mg Oral PRN Derrill Center, NP      . OLANZapine (ZYPREXA) tablet 20 mg  20 mg Oral QHS Pennelope Bracken, MD   20 mg at 01/01/18 2105  . traZODone (DESYREL) tablet 50 mg  50 mg  Oral QHS PRN,MR X 1 Pennelope Bracken, MD   50 mg at 01/01/18 2321   Lab Results: No results found for this or any previous visit (from the past 48 hour(s)).  Blood Alcohol level:  Lab Results  Component Value Date   ETH <10 22/08/5425    Metabolic Disorder Labs: Lab Results  Component Value Date   HGBA1C 6.5 (H) 12/26/2017   MPG 139.85 12/26/2017   MPG 131.24 12/25/2017   Lab Results  Component Value Date   PROLACTIN 50.5 (H) 12/25/2017   Lab Results  Component Value Date   CHOL 146 12/25/2017   TRIG 64 12/25/2017   HDL 50 12/25/2017   CHOLHDL 2.9 12/25/2017   VLDL 13 12/25/2017   LDLCALC 83 12/25/2017   LDLCALC  01/20/2008    68        Total Cholesterol/HDL:CHD Risk Coronary Heart Disease Risk Table                     Men   Women  1/2 Average Risk   3.4   3.3   Physical Findings: AIMS: Facial and Oral Movements Muscles of Facial Expression: None, normal Lips and Perioral Area: None, normal Jaw: None, normal Tongue: None, normal,Extremity Movements Upper (arms, wrists, hands, fingers): None, normal Lower (legs, knees, ankles, toes): None, normal, Trunk Movements Neck, shoulders, hips: None, normal, Overall Severity Severity of abnormal movements (highest score from questions above): None, normal Incapacitation due to abnormal movements: None, normal Patient's awareness of abnormal movements (rate only patient's report): No Awareness, Dental Status Current problems with teeth and/or dentures?: No Does patient usually wear  dentures?: No  CIWA:  CIWA-Ar Total: 1 COWS:  COWS Total Score: 2  Musculoskeletal: Strength & Muscle Tone: within normal limits Gait & Station: normal Patient leans: N/A  Psychiatric Specialty Exam: Physical Exam  Nursing note and vitals reviewed.   Review of Systems  Constitutional: Negative for chills and fever.  Respiratory: Negative for cough and shortness of breath.   Cardiovascular: Negative for chest pain.  Gastrointestinal: Negative for abdominal pain, heartburn, nausea and vomiting.  Psychiatric/Behavioral: Negative for depression, hallucinations and suicidal ideas. The patient is not nervous/anxious and does not have insomnia.     Blood pressure (!) 81/72, pulse (!) 116, temperature 98.5 F (36.9 C), temperature source Oral, resp. rate 18, height 6' (1.829 m), weight 61.2 kg (135 lb).Body mass index is 18.31 kg/m.  General Appearance: Casual and Disheveled  Eye Contact:  Good  Speech:  Garbled and Pressured  Volume:  Normal  Mood:  Euthymic  Affect:  Blunt and Labile  Thought Process:  Coherent and Disorganized  Orientation:  Full (Time, Place, and Person)  Thought Content:  Hallucinations: Auditory and Tangential  Suicidal Thoughts:  No  Homicidal Thoughts:  No  Memory:  Immediate;   Fair Recent;   Fair Remote;   Fair  Judgement:  Fair  Insight:  Lacking  Psychomotor Activity:  Normal  Concentration:  Concentration: Fair  Recall:  [employer] of Knowledge:  Fair  Language:  Fair  Akathisia:  No  Handed:    AIMS (if indicated):     Assets:  Desire for Improvement Resilience  ADL's:  Intact  Cognition:  WNL  Sleep:  Number of Hours: 3.25   Treatment Plan Summary: Daily contact with patient to assess and evaluate symptoms and progress in treatment and Medication management   -Continue inpatient hospitalization.  -Will continue today 01/02/2018 plan as below except  where it is noted.  -Schizoaffective disorder, bipolar type -Continue  Depakote DR 500 mg qAM + 1000mg  Q hs, (level drawn on 12-25-17, result, therapeutic at 76).  -Continue Zyprexa 20 mg po Q hs  -Agitation -Continue benadryl 25mg  po/IM q6h prn agitation-Continue haldol 5mg  po/IM q6h prn agitation -Continue ativan 1mg  po/IM q6h prn agitation  -insomnia -Continue with Trazodone50mg po qhs prn insomnia (may repeat x1)  -Anxiety - Continue vistaril 25mg  po q8h prn anxiety  -Encourage participation in groups and therapeutic milieu  -Disposition planning will be ongoing  Lindell Spar, NP, PMHNP, FNP-BC 01/02/2018, 10:44 AMPatient ID: Frederick Fletcher, male   DOB: [birth year], 57 y.o.   MRN: [id number]

## 2018-01-03 NOTE — Plan of Care (Signed)
D: pt found in his bed this morning resting. "I've already taken my meds". Pt seems pleasant. Pt participated in group this morning. Pt rates his depression/hopelessness/anxiety all 5/10. Pt denies any si/hi/ah/vh and verbally agrees to approach staff if these become apparent. Pt doesn't have a goal for today.  A: pt given support and encouragement. Q49m safety checks implemented and continued. R: pt safe on the unit. Will continue to monitor.  Problem: Education: Goal: Emotional status will improve Outcome: Progressing Goal: Mental status will improve Outcome: Progressing   Problem: Activity: Goal: Interest or engagement in activities will improve Outcome: Progressing Goal: Sleeping patterns will improve Outcome: Progressing   Problem: Coping: Goal: Ability to verbalize frustrations and anger appropriately will improve Outcome: Progressing Goal: Ability to demonstrate self-control will improve Outcome: Progressing   Problem: Physical Regulation: Goal: Ability to maintain clinical measurements within normal limits will improve Outcome: Progressing   Problem: Safety: Goal: Periods of time without injury will increase Outcome: Progressing   Problem: Self-Concept: Goal: Ability to disclose and discuss suicidal ideas will improve Outcome: Progressing   Problem: Activity: Goal: Interest or engagement in leisure activities will improve Outcome: Progressing Goal: Imbalance in normal sleep/wake cycle will improve Outcome: Progressing   Problem: Role Relationship: Goal: Will demonstrate positive changes in social behaviors and relationships Outcome: Progressing   Problem: Safety: Goal: Ability to disclose and discuss suicidal ideas will improve Outcome: Progressing   Problem: Self-Concept: Goal: Level of anxiety will decrease Outcome: Progressing   Problem: Education: Goal: Knowledge of the prescribed therapeutic regimen will improve Outcome: Progressing   Problem:  Coping: Goal: Coping ability will improve Outcome: Progressing Goal: Will verbalize feelings Outcome: Progressing   Problem: Safety: Goal: Ability to remain free from injury will improve Outcome: Progressing

## 2018-01-03 NOTE — Progress Notes (Signed)
Recreation Therapy Notes  Date: 6.10.19 Time: 1000 Location: 500 [address] Dayroom  Group Topic:  Goal Setting  Goal Area(s) Addresses:  Patient will be able to identify at least 3 life goals.  Patient will be able to identify benefit of investing in life goals.  Patient will be able to identify benefit of setting life goals.   Behavioral Response:  Engaged  Intervention: Worksheet  Activity: Lobbyist.  Patients were to identify goals they wanted to accomplish in a week, then next month, within a year and within the next 5 years.  Patients were to also identify obstacles to reaching goals, what they needed in order to achieve goals and what they an do tomorrow to start working towards their goals.  Education:  Discharge Planning, Radiographer, therapeutic, Leisure Education   Education Outcome: Acknowledges Education/In Group Clarification Provided/Needs Additional Education  Clinical Observations: Pt stated goals "make you happy".  Pt needed redirection to get back on task.  Pt identified his goal as "getting out the hospital".  Pt stated he also wants to get a job.  Pt expressed he needs to "focus and slow down, this medicine got me racing".    Victorino Sparrow, LRT/CTRS     Victorino Sparrow A 01/03/2018 12:06 PM

## 2018-01-03 NOTE — Progress Notes (Signed)
D: Pt was awake in bed in his room upon initial approach.  He presents with euphoric affect and mood.  He is difficult to understand at times as his speech is slurred and tangential.  He reports his day was "good" and his goal is to "get out of the hospital whenever I can."  He began talking about "papers in the machine."  He states "I'm just going to lay down and be quiet tonight, rest a little bit, I go to the classes everyday."  Denies SI/HI.  When asked if he is having hallucinations, pt states "not much" and did not elaborate; denies pain.  Pt has been visible in milieu intermittently and he attended evening group.  A: Introduced self to pt. Actively listened to pt and provided support and encouragement.  Medication administered per order.  PRN medication administered for sleep.  Q15 minute safety checks maintained.  R: Pt is safe on the unit.  Pt is compliant with medications.  He verbally contracts for safety and reports he will inform staff of needs and concerns.  Will continue to monitor and assess.

## 2018-01-03 NOTE — Progress Notes (Signed)
[facility] MD Progress Note  01/03/2018 2:21 PM Frederick Fletcher  MRN:  [id number] Subjective:    Frederick Fletcher is a 57 y/o M with history of schizophrenia who was admitted from [facility] ED where he presented brought by police after making SI statements about cutting himself to his landlady. He was medically cleared and then transferred to [facility] for additional treatment and evaluation. He was resumed on home medications of zyprexa, klonopin, and depakote. His depakote level was was found to be therapeutic. Pt reports he was staying in a group home in the [place] area but he does not recall the contact information. Pt has remained generally disorganized in his speech and thoughts, and he continues to endorse AH, but he has been denying SI.His dose of zyprexa has been increased during his stay, and his depakote was changed to be only twice per day (same total daily dose). Pt had improvement of his presenting symptoms, and he became generally appropriate and pleasant on the unit.  Today upon evaluation, pt shares, "I'm alright. I'm not going on with all my stress." Pt is generally bright, cheerful, and joking during interview. He is mildly disorganized in his speech and mumbles incoherently at times, but when asked to slow his speech and repeat himself, pt is able to be redirected. He denies any specific concerns. He is sleeping well. His appetite is good. He denies other physical complaints. He denies SI/HI/AH/VH. He is tolerating his medications without difficulty or side effects. He remains in agreement to be referred to supervised living, and he met with staff from supervised living location on Friday 6/7 but a bed has not been secured at this time. Discussed with patient that we will plan to continue his current regimen without changes and work to secure placement at supervised living as soon as possible, and pt verbalized agreement and understanding. He had no further questions, comments, or  concerns.  Principal Problem: Schizoaffective disorder, bipolar type ([place]) Diagnosis:   Patient Active Problem List   Diagnosis Date Noted  . Schizoaffective disorder, bipolar type ([place]) [F25.0] 12/24/2017   Total Time spent with patient: 30 minutes  Past Psychiatric History: see H&P  Past Medical History: History reviewed. No pertinent past medical history. History reviewed. No pertinent surgical history. Family History: History reviewed. No pertinent family history. Family Psychiatric  History: see H&P Social History:  Social History   Substance and Sexual Activity  Alcohol Use Not Currently  . Frequency: Never     Social History   Substance and Sexual Activity  Drug Use Not Currently    Social History   Socioeconomic History  . Marital status: Single    Spouse name: Not on file  . Number of children: Not on file  . Years of education: Not on file  . Highest education level: Not on file  Occupational History  . Not on file  Social Needs  . Financial resource strain: Not on file  . Food insecurity:    Worry: Not on file    Inability: Not on file  . Transportation needs:    Medical: Not on file    Non-medical: Not on file  Tobacco Use  . Smoking status: Current Every Day Smoker    Packs/day: 1.00    Types: Cigarettes  . Smokeless tobacco: Never Used  Substance and Sexual Activity  . Alcohol use: Not Currently    Frequency: Never  . Drug use: Not Currently  . Sexual activity: Not Currently  Lifestyle  .  Physical activity:    Days per week: Not on file    Minutes per session: Not on file  . Stress: Not on file  Relationships  . Social connections:    Talks on phone: Not on file    Gets together: Not on file    Attends religious service: Not on file    Active member of club or organization: Not on file    Attends meetings of clubs or organizations: Not on file    Relationship status: Not on file  Other Topics Concern  . Not on file  Social History  Narrative  . Not on file   Additional Social History:                         Sleep: Good  Appetite:  Good  Current Medications: Current Facility-Administered Medications  Medication Dose Route Frequency Provider Last Rate Last Dose  . acetaminophen (TYLENOL) tablet 650 mg  650 mg Oral Q6H PRN Money, Lowry Ram, FNP   650 mg at 01/03/18 0744  . alum & mag hydroxide-simeth (MAALOX/MYLANTA) 200-200-20 MG/5ML suspension 30 mL  30 mL Oral Q4H PRN Money, Darnelle Maffucci B, FNP      . diphenhydrAMINE (BENADRYL) capsule 25 mg  25 mg Oral Q6H PRN Money, Lowry Ram, FNP       Or  . diphenhydrAMINE (BENADRYL) injection 25 mg  25 mg Intramuscular Q6H PRN Money, Darnelle Maffucci B, FNP      . divalproex (DEPAKOTE) DR tablet 500 mg  500 mg Oral Tana Conch T, MD   500 mg at 01/03/18 0631   And  . divalproex (DEPAKOTE) DR tablet 1,000 mg  1,000 mg Oral QHS Maris Berger T, MD   1,000 mg at 01/02/18 2120  . famotidine (PEPCID) tablet 40 mg  40 mg Oral QHS Money, Travis B, FNP   40 mg at 01/02/18 2120  . haloperidol (HALDOL) tablet 5 mg  5 mg Oral Q6H PRN Money, Lowry Ram, FNP       Or  . haloperidol lactate (HALDOL) injection 5 mg  5 mg Intramuscular Q6H PRN Money, Lowry Ram, FNP      . hydrOXYzine (ATARAX/VISTARIL) tablet 25 mg  25 mg Oral TID PRN Money, Lowry Ram, FNP   25 mg at 01/03/18 0744  . LORazepam (ATIVAN) tablet 1 mg  1 mg Oral Q6H PRN Money, Lowry Ram, FNP   1 mg at 12/29/17 2354   Or  . LORazepam (ATIVAN) injection 1 mg  1 mg Intramuscular Q6H PRN Money, Darnelle Maffucci B, FNP      . magnesium hydroxide (MILK OF MAGNESIA) suspension 30 mL  30 mL Oral Daily PRN Money, Darnelle Maffucci B, FNP      . nicotine polacrilex (NICORETTE) gum 2 mg  2 mg Oral PRN Derrill Center, NP      . OLANZapine (ZYPREXA) tablet 20 mg  20 mg Oral QHS Pennelope Bracken, MD   20 mg at 01/02/18 2120  . traZODone (DESYREL) tablet 50 mg  50 mg Oral QHS PRN,MR X 1 Pennelope Bracken, MD   50 mg at 01/02/18 2120     Lab Results: No results found for this or any previous visit (from the past 48 hour(s)).  Blood Alcohol level:  Lab Results  Component Value Date   ETH <10 20/35/5974    Metabolic Disorder Labs: Lab Results  Component Value Date   HGBA1C 6.5 (H) 12/26/2017   MPG 139.85 12/26/2017  MPG 131.24 12/25/2017   Lab Results  Component Value Date   PROLACTIN 50.5 (H) 12/25/2017   Lab Results  Component Value Date   CHOL 146 12/25/2017   TRIG 64 12/25/2017   HDL 50 12/25/2017   CHOLHDL 2.9 12/25/2017   VLDL 13 12/25/2017   LDLCALC 83 12/25/2017   LDLCALC  01/20/2008    68        Total Cholesterol/HDL:CHD Risk Coronary Heart Disease Risk Table                     Men   Women  1/2 Average Risk   3.4   3.3    Physical Findings: AIMS: Facial and Oral Movements Muscles of Facial Expression: None, normal Lips and Perioral Area: None, normal Jaw: None, normal Tongue: None, normal,Extremity Movements Upper (arms, wrists, hands, fingers): None, normal Lower (legs, knees, ankles, toes): None, normal, Trunk Movements Neck, shoulders, hips: None, normal, Overall Severity Severity of abnormal movements (highest score from questions above): None, normal Incapacitation due to abnormal movements: None, normal Patient's awareness of abnormal movements (rate only patient's report): No Awareness, Dental Status Current problems with teeth and/or dentures?: No Does patient usually wear dentures?: No  CIWA:  CIWA-Ar Total: 1 COWS:  COWS Total Score: 2  Musculoskeletal: Strength & Muscle Tone: within normal limits Gait & Station: normal Patient leans: N/A  Psychiatric Specialty Exam: Physical Exam  Nursing note and vitals reviewed.   Review of Systems  Constitutional: Negative for chills and fever.  Respiratory: Negative for cough and shortness of breath.   Cardiovascular: Negative for chest pain.  Gastrointestinal: Negative for abdominal pain, heartburn, nausea and vomiting.   Psychiatric/Behavioral: Negative for depression, hallucinations and suicidal ideas. The patient is not nervous/anxious and does not have insomnia.     Blood pressure (!) 87/62, pulse (!) 109, temperature 98.6 F (37 C), temperature source Oral, resp. rate 18, height 6' (1.829 m), weight 61.2 kg (135 lb).Body mass index is 18.31 kg/m.  General Appearance: Casual and Fairly Groomed  Eye Contact:  Good  Speech:  Clear and Coherent and Normal Rate  Volume:  Normal  Mood:  Euthymic  Affect:  Blunt and Congruent  Thought Process:  Coherent and Goal Directed  Orientation:  Full (Time, Place, and Person)  Thought Content:  Logical  Suicidal Thoughts:  No  Homicidal Thoughts:  No  Memory:  Immediate;   Fair Recent;   Fair Remote;   Fair  Judgement:  Fair  Insight:  Lacking  Psychomotor Activity:  Normal  Concentration:  Concentration: Fair  Recall:  [employer] of Knowledge:  Fair  Language:  Fair  Akathisia:  No  Handed:    AIMS (if indicated):     Assets:  [employer]ed forces logistics/support/administrative officer Physical Health  ADL's:  Intact  Cognition:  WNL  Sleep:  Number of Hours: 6.25   Treatment Plan Summary: Daily contact with patient to assess and evaluate symptoms and progress in treatment and Medication management   -Continue inpatient hospitalization  -Schizoaffective disorder, bipolar type -ContinueDepakote DR 500 mg qAM + 1016m Q hs, (level drawn on 12-25-17, result, therapeutic at 76). -ContinueZyprexa 20 mg po Q hs  -Agitation -Continue benadryl 211mpo/IM q6h prn agitation   -Continue haldol 88m42mo/IM q6h prn agitation -Continue ativan 1mg788m/IM q6h prn agitation  -insomnia -Continue with Trazodone50mg23mhs prn insomnia (may repeat x1)  -Anxiety - Continue vistaril 288mg 81m8h prn anxiety  -Encourage participation in groups and therapeutic  milieu  -Disposition planning will be  ongoing  Pennelope Bracken, MD 01/03/2018, 2:21 PM

## 2018-01-03 NOTE — Progress Notes (Signed)
Patient ID: Frederick Fletcher, male   DOB: 08-30-1960, 57 y.o.   MRN: [id number]  D: Patient appears disorganized talking to himself on approach. Pt speech at times slurre and difficult to understand. Pt otherwise pleasant. Pt endorses AH without command. Pt reports he tells the voices to be quite. Denies  SI/HI and pain.No behavioral issues noted.  A: Support and encouragement offered as needed to express needs. Medications administered as prescribed.  R: Patient is safe and cooperative on unit. Will continue to monitor  for safety and stability.

## 2018-01-03 NOTE — [facility] Group Notes (Signed)
[facility] Group Notes:  (Nursing/MHT/Case Management/Adjunct)  Date:  01/03/2018  Time:  9:08 AM  Type of Therapy:  Orientation and Goals Group   Participation Level:  Active  Participation Quality:  Appropriate and Attentive  Affect:  Appropriate  Cognitive:  Appropriate  Insight:  Good  Engagement in Group:  Improving  Modes of Intervention:  Discussion and Orientation  Summary of Progress/Problems: His goal for today is to get discharged and if he gets discharged, he plans to relax, watch and stay out of trouble.   Candra Wegner J Jullian Previti 01/03/2018, 9:08 AM

## 2018-01-04 MED ORDER — TUBERCULIN PPD 5 UNIT/0.1ML ID SOLN
5.0000 [IU] | Freq: Once | INTRADERMAL | Status: DC
  Administered 2018-01-04: 5 [IU] via INTRADERMAL

## 2018-01-04 NOTE — [facility] Group Notes (Signed)
LCSW Group Therapy Note   01/04/2018 1:15pm   Type of Therapy and Topic:  Group Therapy:  Positive Affirmations   Participation Level:  Minimal  Description of Group: This group addressed positive affirmation toward self and others. Patients went around the room and identified two positive things about themselves and two positive things about a peer in the room. Patients reflected on how it felt to share something positive with others, to identify positive things about themselves, and to hear positive things from others. Patients were encouraged to have a daily reflection of positive characteristics or circumstances.  Therapeutic Goals 1. Patient will verbalize two of their positive qualities 2. Patient will demonstrate empathy for others by stating two positive qualities about a peer in the group 3. Patient will verbalize their feelings when voicing positive self affirmations and when voicing positive affirmations of others 4. Patients will discuss the potential positive impact on their wellness/recovery of focusing on positive traits of self and others. Summary of Patient Progress:  Spoke a lot, but mostly to himself or anyone else that might be listening while others were talking. Attempted redirection with mixed results.  "I think of family."  Therapeutic Modalities Cognitive [place], [place] 01/04/2018 1:35 PM

## 2018-01-04 NOTE — Progress Notes (Signed)
[facility] MD Progress Note  01/04/2018 3:26 PM Frederick Fletcher  MRN:  [id number]  Subjective: Frederick Fletcher reports, "It's all good today. I just got my TB shot. My mood is sunshine today. No, not any side effects. I have been going to groups everyday".  Frederick Fletcher is a 57 y/o M with history of schizophrenia who was admitted from [facility] ED where he presented brought by police after making SI statements about cutting himself to his landlady. He was medically cleared and then transferred to [facility] for additional treatment and evaluation. He was resumed on home medications of zyprexa, klonopin, and depakote. His depakote level was was found to be therapeutic. Pt reports he was staying in a group home in the [place] area but he does not recall the contact information. Pt has remained generally disorganized in his speech and thoughts, and he continues to endorse AH, but he has been denying SI.His dose of zyprexa has been increased during his stay, and his depakote was changed to be only twice per day (same total daily dose). Pt had improvement of his presenting symptoms, and he became generally appropriate and pleasant on the unit.  This is from previous evaluation & nothing has changed for this patient. It is possible that he is currently at his baseline level of function. Today upon evaluation, pt shares, "I'm alright. I'm not going on with all my stress." Pt is generally bright, cheerful, and joking during interview. He is mildly disorganized in his speech and mumbles incoherently at times, but when asked to slow his speech and repeat himself, pt is able to be redirected. He denies any specific concerns. He is sleeping well. His appetite is good. He denies other physical complaints. He denies SI/HI/AH/VH. He is tolerating his medications without difficulty or side effects. He remains in agreement to be referred to supervised living, and he met with staff from supervised living location on Friday 6/7 but a  bed has not been secured at this time. Discussed with patient that we will plan to continue his current regimen without changes and work to secure placement at supervised living as soon as possible, and pt verbalized agreement and understanding. He had no further questions, comments, or concerns.   Principal Problem: Schizoaffective disorder, bipolar type ([place])  Diagnosis:   Patient Active Problem List   Diagnosis Date Noted  . Schizoaffective disorder, bipolar type ([place]) [F25.0] 12/24/2017   Total Time spent with patient: 15 minutes  Past Psychiatric History: See H&P  Past Medical History: History reviewed. No pertinent past medical history. History reviewed. No pertinent surgical history. Family History: History reviewed. No pertinent family history.  Family Psychiatric  History: See H&P  Social History:  Social History   Substance and Sexual Activity  Alcohol Use Not Currently  . Frequency: Never     Social History   Substance and Sexual Activity  Drug Use Not Currently    Social History   Socioeconomic History  . Marital status: Single    Spouse name: Not on file  . Number of children: Not on file  . Years of education: Not on file  . Highest education level: Not on file  Occupational History  . Not on file  Social Needs  . Financial resource strain: Not on file  . Food insecurity:    Worry: Not on file    Inability: Not on file  . Transportation needs:    Medical: Not on file    Non-medical: Not on file  Tobacco  Use  . Smoking status: Current Every Day Smoker    Packs/day: 1.00    Types: Cigarettes  . Smokeless tobacco: Never Used  Substance and Sexual Activity  . Alcohol use: Not Currently    Frequency: Never  . Drug use: Not Currently  . Sexual activity: Not Currently  Lifestyle  . Physical activity:    Days per week: Not on file    Minutes per session: Not on file  . Stress: Not on file  Relationships  . Social connections:    Talks on phone:  Not on file    Gets together: Not on file    Attends religious service: Not on file    Active member of club or organization: Not on file    Attends meetings of clubs or organizations: Not on file    Relationship status: Not on file  Other Topics Concern  . Not on file  Social History Narrative  . Not on file   Additional Social History:   Sleep: Good  Appetite:  Good  Current Medications: Current Facility-Administered Medications  Medication Dose Route Frequency Provider Last Rate Last Dose  . acetaminophen (TYLENOL) tablet 650 mg  650 mg Oral Q6H PRN Money, Lowry Ram, FNP   650 mg at 01/03/18 0744  . alum & mag hydroxide-simeth (MAALOX/MYLANTA) 200-200-20 MG/5ML suspension 30 mL  30 mL Oral Q4H PRN Money, Darnelle Maffucci B, FNP      . diphenhydrAMINE (BENADRYL) capsule 25 mg  25 mg Oral Q6H PRN Money, Lowry Ram, FNP       Or  . diphenhydrAMINE (BENADRYL) injection 25 mg  25 mg Intramuscular Q6H PRN Money, Darnelle Maffucci B, FNP      . divalproex (DEPAKOTE) DR tablet 500 mg  500 mg Oral [place] Menghini, MD   500 mg at 01/04/18 0756   And  . divalproex (DEPAKOTE) DR tablet 1,000 mg  1,000 mg Oral QHS Pennelope Bracken, MD   1,000 mg at 01/03/18 2105  . famotidine (PEPCID) tablet 40 mg  40 mg Oral QHS Money, Travis B, FNP   40 mg at 01/03/18 2105  . haloperidol (HALDOL) tablet 5 mg  5 mg Oral Q6H PRN Money, Lowry Ram, FNP       Or  . haloperidol lactate (HALDOL) injection 5 mg  5 mg Intramuscular Q6H PRN Money, Lowry Ram, FNP      . hydrOXYzine (ATARAX/VISTARIL) tablet 25 mg  25 mg Oral TID PRN Money, Lowry Ram, FNP   25 mg at 01/03/18 0744  . LORazepam (ATIVAN) tablet 1 mg  1 mg Oral Q6H PRN Money, Lowry Ram, FNP   1 mg at 12/29/17 2354   Or  . LORazepam (ATIVAN) injection 1 mg  1 mg Intramuscular Q6H PRN Money, Darnelle Maffucci B, FNP      . magnesium hydroxide (MILK OF MAGNESIA) suspension 30 mL  30 mL Oral Daily PRN Money, Darnelle Maffucci B, FNP      . nicotine polacrilex (NICORETTE) gum 2 mg  2  mg Oral PRN Derrill Center, NP      . OLANZapine (ZYPREXA) tablet 20 mg  20 mg Oral QHS Pennelope Bracken, MD   20 mg at 01/03/18 2105  . traZODone (DESYREL) tablet 50 mg  50 mg Oral QHS PRN,MR X 1 Pennelope Bracken, MD   50 mg at 01/03/18 2216  . tuberculin injection 5 Units  5 Units Intradermal Once Pennelope Bracken, MD   5 Units at 01/04/18 1036  Lab Results: No results found for this or any previous visit (from the past 48 hour(s)).  Blood Alcohol level:  Lab Results  Component Value Date   ETH <10 09/47/0962   Metabolic Disorder Labs: Lab Results  Component Value Date   HGBA1C 6.5 (H) 12/26/2017   MPG 139.85 12/26/2017   MPG 131.24 12/25/2017   Lab Results  Component Value Date   PROLACTIN 50.5 (H) 12/25/2017   Lab Results  Component Value Date   CHOL 146 12/25/2017   TRIG 64 12/25/2017   HDL 50 12/25/2017   CHOLHDL 2.9 12/25/2017   VLDL 13 12/25/2017   LDLCALC 83 12/25/2017   LDLCALC  01/20/2008    68        Total Cholesterol/HDL:CHD Risk Coronary Heart Disease Risk Table                     Men   Women  1/2 Average Risk   3.4   3.3   Physical Findings: AIMS: Facial and Oral Movements Muscles of Facial Expression: None, normal Lips and Perioral Area: None, normal Jaw: None, normal Tongue: None, normal,Extremity Movements Upper (arms, wrists, hands, fingers): None, normal Lower (legs, knees, ankles, toes): None, normal, Trunk Movements Neck, shoulders, hips: None, normal, Overall Severity Severity of abnormal movements (highest score from questions above): None, normal Incapacitation due to abnormal movements: None, normal Patient's awareness of abnormal movements (rate only patient's report): No Awareness, Dental Status Current problems with teeth and/or dentures?: No Does patient usually wear dentures?: No  CIWA:  CIWA-Ar Total: 1 COWS:  COWS Total Score: 2  Musculoskeletal: Strength & Muscle Tone: within normal limits Gait &  Station: normal Patient leans: N/A  Psychiatric Specialty Exam: Physical Exam  Nursing note and vitals reviewed.   Review of Systems  Constitutional: Negative for chills and fever.  Respiratory: Negative for cough and shortness of breath.   Cardiovascular: Negative for chest pain.  Gastrointestinal: Negative for abdominal pain, heartburn, nausea and vomiting.  Psychiatric/Behavioral: Negative for depression, hallucinations and suicidal ideas. The patient is not nervous/anxious and does not have insomnia.     Blood pressure (!) 95/51, pulse (!) 108, temperature 98.2 F (36.8 C), resp. rate 18, height 6' (1.829 m), weight 61.2 kg (135 lb).Body mass index is 18.31 kg/m.  General Appearance: Casual and Fairly Groomed  Eye Contact:  Good  Speech:  Clear and Coherent and Normal Rate  Volume:  Normal  Mood:  Euthymic  Affect:  Blunt and Congruent  Thought Process:  Coherent and Goal Directed  Orientation:  Full (Time, Place, and Person)  Thought Content:  Logical  Suicidal Thoughts:  No  Homicidal Thoughts:  No  Memory:  Immediate;   Fair Recent;   Fair Remote;   Fair  Judgement:  Fair  Insight:  Lacking  Psychomotor Activity:  Normal  Concentration:  Concentration: Fair  Recall:  [employer] of Knowledge:  Fair  Language:  Fair  Akathisia:  No  Handed:    AIMS (if indicated):     Assets:  [employer]ed [employer] logistics/support/administrative officer Physical Health  ADL's:  Intact  Cognition:  WNL  Sleep:  Number of Hours: 4.25   Treatment Plan Summary: Daily contact with patient to assess and evaluate symptoms and progress in treatment and Medication management   -Continue inpatient hospitalization.  -Will continue today 01/04/2018 plan as below except where it is noted.  -Schizoaffective disorder, bipolar type -ContinueDepakote DR 500 mg qAM + 1036m Q hs, (level drawn  on 12-25-17, result, therapeutic at 76). -ContinueZyprexa 20 mg po Q hs  -Agitation -Continue  benadryl 80m po/IM q6h prn agitation   -Continue haldol 526mpo/IM q6h prn agitation -Continue ativan 108m45mo/IM q6h prn agitation  -insomnia -Continue with Trazodone50m7mqhs prn insomnia (may repeat x1)  -Anxiety - Continue vistaril 25mg7108mq8h prn anxiety  -Encourage participation in groups and therapeutic milieu  -Disposition planning will be ongoing.  AgnesLindell Spar PMHNP, FNP-BC. 01/04/2018, 3:26 PMPatient ID: KeithTyson Aliase   DOB: 05/2122-Oct-1962y.2   MRN: [id number]

## 2018-01-04 NOTE — Progress Notes (Signed)
D: Pt was in dayroom upon initial approach.  He presents with euphoric affect and mood.  Speech remains loud and tangential.  He reports his day was "great" and he is "getting my head back together, my thoughts getting better in my head."  Denies SI/HI, hallucinations, and pain.  He states "still working on where I'm going to stay, a motel or boarding home."  Pt has been visible in milieu interacting with peers and staff appropriately.  He attended evening group.  A: Met with pt 1:1 and provided support and encouragement.  Medication administered per order.  PRN medication administered for sleep.  Q15 minute safety checks maintained.  R: Pt is compliant with medications.  He verbally contracts for safety and reports he will inform staff of needs and concerns.  Will continue to monitor and assess.  

## 2018-01-04 NOTE — Plan of Care (Signed)
  Problem: Activity: Goal: Interest or engagement in activities will improve Outcome: Progressing   Problem: Safety: Goal: Periods of time without injury will increase Outcome: Progressing   Problem: Coping: Goal: Coping ability will improve Outcome: Progressing  DAR NOTE: Patient presents with calm affect and hyper active mood mood.  Denies suicidal thoughts, pain, auditory and visual hallucinations.  Rates depression at 0, hopelessness at 0, and anxiety at 0.  Maintained on routine safety checks.  Medications given as prescribed.  Support and encouragement offered as needed.  Attended group and participated.  Patient observed socializing with peers in the dayroom.  PPD placed on left forearm.  Patient is safe on and off the unit.  Offered no complaint.

## 2018-01-04 NOTE — Tx Team (Addendum)
Interdisciplinary Treatment and Diagnostic Plan Update  01/04/2018 Time of Session: 1:49 PM  LEOTIS ISHAM MRN: [id number]  Principal Diagnosis: Schizoaffective disorder, bipolar type [facility])  Secondary Diagnoses: Principal Problem:   Schizoaffective disorder, bipolar type ([place])   Current Medications:  Current Facility-Administered Medications  Medication Dose Route Frequency Provider Last Rate Last Dose  . acetaminophen (TYLENOL) tablet 650 mg  650 mg Oral Q6H PRN Money, Lowry Ram, FNP   650 mg at 01/03/18 0744  . alum & mag hydroxide-simeth (MAALOX/MYLANTA) 200-200-20 MG/5ML suspension 30 mL  30 mL Oral Q4H PRN Money, Darnelle Maffucci B, FNP      . diphenhydrAMINE (BENADRYL) capsule 25 mg  25 mg Oral Q6H PRN Money, Lowry Ram, FNP       Or  . diphenhydrAMINE (BENADRYL) injection 25 mg  25 mg Intramuscular Q6H PRN Money, Darnelle Maffucci B, FNP      . divalproex (DEPAKOTE) DR tablet 500 mg  500 mg Oral Rockne Menghini, MD   500 mg at 01/04/18 0756   And  . divalproex (DEPAKOTE) DR tablet 1,000 mg  1,000 mg Oral QHS Pennelope Bracken, MD   1,000 mg at 01/03/18 2105  . famotidine (PEPCID) tablet 40 mg  40 mg Oral QHS Money, Travis B, FNP   40 mg at 01/03/18 2105  . haloperidol (HALDOL) tablet 5 mg  5 mg Oral Q6H PRN Money, Lowry Ram, FNP       Or  . haloperidol lactate (HALDOL) injection 5 mg  5 mg Intramuscular Q6H PRN Money, Lowry Ram, FNP      . hydrOXYzine (ATARAX/VISTARIL) tablet 25 mg  25 mg Oral TID PRN Money, Lowry Ram, FNP   25 mg at 01/03/18 0744  . LORazepam (ATIVAN) tablet 1 mg  1 mg Oral Q6H PRN Money, Lowry Ram, FNP   1 mg at 12/29/17 2354   Or  . LORazepam (ATIVAN) injection 1 mg  1 mg Intramuscular Q6H PRN Money, Darnelle Maffucci B, FNP      . magnesium hydroxide (MILK OF MAGNESIA) suspension 30 mL  30 mL Oral Daily PRN Money, Darnelle Maffucci B, FNP      . nicotine polacrilex (NICORETTE) gum 2 mg  2 mg Oral PRN Derrill Center, NP      . OLANZapine (ZYPREXA) tablet 20 mg  20 mg Oral QHS  Pennelope Bracken, MD   20 mg at 01/03/18 2105  . traZODone (DESYREL) tablet 50 mg  50 mg Oral QHS PRN,MR X 1 Pennelope Bracken, MD   50 mg at 01/03/18 2216  . tuberculin injection 5 Units  5 Units Intradermal Once Pennelope Bracken, MD   5 Units at 01/04/18 1036    PTA Medications: Medications Prior to Admission  Medication Sig Dispense Refill Last Dose  . benztropine (COGENTIN) 0.5 MG tablet Take 0.5 mg by mouth 2 (two) times daily.   Past Week at Unknown time  . clonazePAM (KLONOPIN) 0.5 MG tablet Take 0.25 mg by mouth 2 (two) times daily as needed for anxiety.   Past Week at Unknown time  . divalproex (DEPAKOTE ER) 500 MG 24 hr tablet Take 500 mg by mouth 3 (three) times daily.   Past Week at Unknown time  . OLANZapine (ZYPREXA) 20 MG tablet Take 20 mg by mouth 2 (two) times daily.   Past Week at Unknown time  . ranitidine (ZANTAC) 300 MG tablet Take 300 mg by mouth at bedtime as needed for heartburn.   Past Week at Unknown  time    Patient Stressors: Medication change or noncompliance  Patient Strengths: Ability for insight Average or above average intelligence General fund of knowledge  Treatment Modalities: Medication Management, Group therapy, Case management,  1 to 1 session with clinician, Psychoeducation, Recreational therapy.   Physician Treatment Plan for Primary Diagnosis: Schizoaffective disorder, bipolar type ([place]) Long Term Goal(s): Improvement in symptoms so as ready for discharge  Short Term Goals: Ability to identify changes in lifestyle to reduce recurrence of condition will improve Ability to verbalize feelings will improve Ability to demonstrate self-control will improve Compliance with prescribed medications will improve Ability to identify changes in lifestyle to reduce recurrence of condition will improve Ability to identify and develop effective coping behaviors will improve Compliance with prescribed medications will  improve  Medication Management: Evaluate patient's response, side effects, and tolerance of medication regimen.  Therapeutic Interventions: 1 to 1 sessions, Unit Group sessions and Medication administration.  Evaluation of Outcomes: Adequate for Discharge   6/6:  Wlliam remains alert, oriented to name, friendly with pressured speech, tangentiality & disorganization of his speech. He presents at times, inappropriate. Pt denies any specific concerns today. He is sleeping well. His appetite is good. He denies SI/HI.  He is tolerating his medications without difficulty or side effects. He is in agreement to continue his current regimen without changes. -Schizoaffective disorder, bipolar type -ContinueDepakote DR '500mg'$  qAM + '1000mg'$  qHs  -ContinueZyprexa '20mg'$  po qhs  -Agitation -Continue benadryl '25mg'$  po/IM q6h prn agitation -Continue haldol '5mg'$  po/IM q6h prn agitation -Continue ativan '1mg'$  po/IM q6h prn agitation 6/11: Pt is generally bright, cheerful, and joking during interview. He is mildly disorganized in his speech and mumbles incoherently at times, but when asked to slow his speech and repeat himself, pt is able to be redirected No med changes.       Physician Treatment Plan for Secondary Diagnosis: Principal Problem:   Schizoaffective disorder, bipolar type ([place])   Long Term Goal(s): Improvement in symptoms so as ready for discharge  Short Term Goals: Ability to identify changes in lifestyle to reduce recurrence of condition will improve Ability to verbalize feelings will improve Ability to demonstrate self-control will improve Compliance with prescribed medications will improve Ability to identify changes in lifestyle to reduce recurrence of condition will improve Ability to identify and develop effective coping behaviors will improve Compliance with prescribed medications will improve  Medication Management:  Evaluate patient's response, side effects, and tolerance of medication regimen.  Therapeutic Interventions: 1 to 1 sessions, Unit Group sessions and Medication administration.  Evaluation of Outcomes: Adequate for Discharge   RN Treatment Plan for Primary Diagnosis: Schizoaffective disorder, bipolar type ([place]) Long Term Goal(s): Knowledge of disease and therapeutic regimen to maintain health will improve  Short Term Goals: Ability to identify and develop effective coping behaviors will improve and Compliance with prescribed medications will improve  Medication Management: RN will administer medications as ordered by provider, will assess and evaluate patient's response and provide education to patient for prescribed medication. RN will report any adverse and/or side effects to prescribing provider.  Therapeutic Interventions: 1 on 1 counseling sessions, Psychoeducation, Medication administration, Evaluate responses to treatment, Monitor vital signs and CBGs as ordered, Perform/monitor CIWA, COWS, AIMS and Fall Risk screenings as ordered, Perform wound care treatments as ordered.  Evaluation of Outcomes: Adequate for Discharge   LCSW Treatment Plan for Primary Diagnosis: Schizoaffective disorder, bipolar type ([place]) Long Term Goal(s): Safe transition to appropriate next level of care at discharge, Engage patient in  therapeutic group addressing interpersonal concerns.  Short Term Goals: Engage patient in aftercare planning with referrals and resources  Therapeutic Interventions: Assess for all discharge needs, 1 to 1 time with Social worker, Explore available resources and support systems, Assess for adequacy in community support network, Educate family and significant other(s) on suicide prevention, Complete Psychosocial Assessment, Interpersonal group therapy.  Evaluation of Outcomes: Met   6/6:  Ms Kendra Opitz, with whom pt has lived in past, will pick pt up to take him to her supported  living facility.  Follow up at [facility] with support of TCT.  Progress in Treatment: Attending groups: Yes Participating in groups: Yes Taking medication as prescribed: Yes Toleration medication: Yes, no side effects reported at this time Family/Significant other contact made: No Patient understands diagnosis: No Limited insight Discussing patient identified problems/goals with staff: Yes Medical problems stabilized or resolved: Yes Denies suicidal/homicidal ideation: Yes Issues/concerns per patient self-inventory: None Other: N/A  New problem(s) identified: None identified at this time.   New Short Term/Long Term Goal(s): "I'd like to use my head to better myself.  I see pictures of myself on TV. Lucifer will kill my soul."  Discharge Plan or Barriers:   Reason for Continuation of Hospitalization:   Estimated Length of Stay: D/C tomorrow  Attendees: Patient:  01/04/2018  1:49 PM  Physician: Maris Berger, MD 01/04/2018  1:49 PM  Nursing: Elesa Massed, RN 01/04/2018  1:49 PM  RN Care Manager: Lars Pinks, RN 01/04/2018  1:49 PM  Social Worker: Ripley Fraise 01/04/2018  1:49 PM  Recreational Therapist: Winfield Cunas 01/04/2018  1:49 PM  Other: Norberto Sorenson 01/04/2018  1:49 PM  Other:  01/04/2018  1:49 PM    Scribe for Treatment Team:  Roque Lias LCSW 01/04/2018 1:49 PM

## 2018-01-05 MED ORDER — HYDROXYZINE HCL 25 MG PO TABS: 25.0000 mg | ORAL_TABLET | Freq: Three times a day (TID) | ORAL | 0 refills | Status: DC | PRN

## 2018-01-05 MED ORDER — OLANZAPINE 20 MG PO TABS: 20.0000 mg | ORAL_TABLET | Freq: Every day | ORAL | 0 refills | Status: AC

## 2018-01-05 MED ORDER — FAMOTIDINE 40 MG PO TABS: 40.0000 mg | ORAL_TABLET | Freq: Every day | ORAL | 0 refills | Status: DC

## 2018-01-05 MED ORDER — NICOTINE POLACRILEX 2 MG MT GUM: 2.0000 mg | CHEWING_GUM | OROMUCOSAL | 0 refills | Status: DC | PRN

## 2018-01-05 MED ORDER — DIVALPROEX SODIUM 500 MG PO DR TAB: 500.0000 mg | DELAYED_RELEASE_TABLET | ORAL | 0 refills | Status: AC

## 2018-01-05 MED ORDER — TRAZODONE HCL 50 MG PO TABS: 50.0000 mg | ORAL_TABLET | Freq: Every evening | ORAL | 0 refills | Status: DC | PRN

## 2018-01-05 NOTE — Progress Notes (Signed)
  [facility] Adult Case Management Discharge Plan :  Will you be returning to the same living situation after discharge:  No. At discharge, do you have transportation home?: Yes,  Ms Georgann Housekeeper you have the ability to pay for your medications: Yes,  MCD  Release of information consent forms completed and in the chart;  Patient's signature needed at discharge.  Patient to Follow up at: Follow-up Information    [facility] Follow up on 01/11/2018.   Specialty:  Behavioral Health Why:  Tuesday at 8:45 with Josph Macho for your hospital follow up appointment. TCT can help you with transportation to appointments and provide other supports.  Call Jessie at [phone] Contact information: [place] [address] [phone]           Next level of care provider has access to [place] and Suicide Prevention discussed: Yes,  yes  Have you used any form of tobacco in the last 30 days? (Cigarettes, Smokeless Tobacco, Cigars, and/or Pipes): Yes  Has patient been referred to the Quitline?: Patient refused referral  Patient has been referred for addiction treatment: [place], LCSW 01/05/2018, 11:51 AM

## 2018-01-05 NOTE — Progress Notes (Signed)
Recreation Therapy Notes  Date: 6.12.19 Time: 1000 Location: 500 Hall Dayroom  Group Topic: Communication, Team Building, Problem Solving  Goal Area(s) Addresses:  Patient will effectively work with peer towards shared goal.  Patient will identify skill used to make activity successful.  Patient will identify how skills used during activity can be used to reach post d/c goals.   Behavioral Response: Minimal  Intervention: STEM Activity   Activity: In team's, using 20 plastic straws and a long piece of masking tape, patients were to construct a stand alone bridge that could hold the weight of a small puzzle box.   Education: Education officer, community, Dentist.   Education Outcome: Acknowledges education/In group clarification offered/Needs additional education.   Clinical Observations/Feedback: Pt was bright and gave minimal assistance to his group.  Pt was dancing to the background music and social with peers and LRT.    Victorino Sparrow, LRT/CTRS     Victorino Sparrow A 01/05/2018 12:35 PM

## 2018-01-05 NOTE — Progress Notes (Signed)
Pt discharged to a group home, picked up by Ms. Davis . Pt was ambulatory, stable and appreciative at that time. All papers and prescriptions were given and valuables returned. Verbal understanding expressed. Denies SI/HI and A/VH. Pt given opportunity to express concerns and ask questions.

## 2018-01-05 NOTE — [facility] Suicide Risk Assessment (Signed)
[facility] Discharge Suicide Risk Assessment   Principal Problem: Schizoaffective disorder, bipolar type [facility]) Discharge Diagnoses:  Patient Active Problem List   Diagnosis Date Noted  . Schizoaffective disorder, bipolar type ([place]) [F25.0] 12/24/2017    Total Time spent with patient: 30 minutes  Musculoskeletal: Strength & Muscle Tone: within normal limits Gait & Station: normal Patient leans: N/A  Psychiatric Specialty Exam: Review of Systems  Constitutional: Negative for chills and fever.  Respiratory: Negative for cough and shortness of breath.   Cardiovascular: Negative for chest pain.  Gastrointestinal: Negative for abdominal pain, heartburn, nausea and vomiting.  Psychiatric/Behavioral: Negative for depression, hallucinations and suicidal ideas. The patient is not nervous/anxious and does not have insomnia.     Blood pressure 99/71, pulse 91, temperature 98.4 F (36.9 C), temperature source Oral, resp. rate 20, height 6' (1.829 m), weight 61.2 kg (135 lb).Body mass index is 18.31 kg/m.  General Appearance: Casual and Fairly Groomed  Eye Contact::  Good  Speech:  Garbled and Normal Rate  Volume:  Normal  Mood:  Euthymic  Affect:  Congruent and Full Range  Thought Process:  Coherent and Goal Directed  Orientation:  Full (Time, Place, and Person)  Thought Content:  Logical  Suicidal Thoughts:  No  Homicidal Thoughts:  No  Memory:  Immediate;   Fair Recent;   Fair Remote;   Fair  Judgement:  Fair  Insight:  Fair  Psychomotor Activity:  Normal  Concentration:  Fair  Recall:  [employer] of Knowledge:Fair  Language: Poor  Akathisia:  No  Handed:    AIMS (if indicated):     Assets:  Resilience Social Support  Sleep:  Number of Hours: 6  Cognition: WNL  ADL's:  Intact   Mental Status Per Nursing Assessment::   On Admission:  Suicidal ideation indicated by patient, Self-harm thoughts, Thoughts of violence towards others  Demographic Factors:  Male, Low socioeconomic  status and Unemployed  Loss Factors: NA  Historical Factors: Impulsivity  Risk Reduction Factors:   Living with another person, especially a relative, Positive social support, Positive therapeutic relationship and Positive coping skills or problem solving skills  Continued Clinical Symptoms:  Severe Anxiety and/or Agitation Bipolar Disorder:   Depressive phase Schizophrenia:   Paranoid or undifferentiated type  Cognitive Features That Contribute To Risk:  None    Suicide Risk:  Minimal: No identifiable suicidal ideation.  Patients presenting with no risk factors but with morbid ruminations; may be classified as minimal risk based on the severity of the depressive symptoms  Follow-up Information    [facility] Follow up on 01/11/2018.   Specialty:  Behavioral Health Why:  Tuesday at 8:45 with Josph Macho for your hospital follow up appointment. TCT can help you with transportation to appointments and provide other supports.  Call [place] at [phone] information: [place] [address] [phone]         Subjective Data:  Garnie Borchardt is a 57 y/o M with history of schizophrenia who was admitted from [facility] ED where he presented brought by police after making SI statements about cutting himself to his landlady. He was medically cleared and then transferred to [facility] for additional treatment and evaluation. He was resumed on home medications of zyprexa, klonopin, and depakote. His depakote level was was found to be therapeutic. Pt reports he was staying in a group home in the [place] area but he does not recall the contact information. Pt has remained generally disorganized in his  speech and thoughts, and he continues to endorse AH, but he has been denying SI.His dose of zyprexa has been increased during his stay, and his depakote was changed to be only twice per day (same total daily dose).Pt had improvement of his presenting symptoms.  Today upon  evaluation, pt shares, "I'm doing alright." His speech remains somewhat disorganized and garbled, but pt is goal-directed and he is easily redirected when getting off topic. He denies any specific concerns. He shares that he is excited about being accepted for placement at a supervised living. He shares about things he plans to do after leaving the hospital such as "ride the bus by myself" and "read my Bible." He denies any physical complaints today. He is sleeping well. His appetite is good. He denies SI/HI/AH/VH. He is tolerating his medications well, and he is in agreement to continue his current regimen without changes. He plans to follow up at [facility] after discharge. He was able to engage in safety planning including plan to return to [facility] or contact emergency services if he feels unable to maintain his own safety or the safety of others. Pt had no further questions, comments, or concerns.   Plan Of Care/Follow-up recommendations:   -Discharge to outpatient level of care  -Schizoaffective disorder, bipolar type -ContinueDepakote DR 500 mg qAM + 1000mg  Q hs, (level drawn on 12-25-17, result, therapeutic at 76). -ContinueZyprexa 20 mg po Q hs  -insomnia -Continue with Trazodone50mg po qhs prn insomnia  -Anxiety - Continue vistaril 25mg  po q8h prn anxiety  Activity:  as tolerated Diet:  normal Tests:  NA Other:  see above for [place], MD 01/05/2018, 10:42 AM

## 2018-01-05 NOTE — Discharge Summary (Addendum)
Physician Discharge Summary Note  Patient:  Frederick Fletcher is an 57 y.o., male  MRN:  [id number]  DOB:  1960/10/25  Patient phone:  [phone] (home)   Patient address:   [address] [place] [address],   Total Time spent with patient: Greater than 30 minutes  Date of Admission:  12/24/2017  Date of Discharge: 01-05-18  Reason for Admission: patient made suicidal statements about cutting himself to his landlady.   Principal Problem: Schizoaffective disorder, bipolar type [facility])  Discharge Diagnoses: Patient Active Problem List   Diagnosis Date Noted  . Schizoaffective disorder, bipolar type ([place]) [F25.0] 12/24/2017   Past Psychiatric History: Schizoaffective disorder.  Past Medical History: History reviewed. No pertinent past medical history. History reviewed. No pertinent surgical history. Family History: History reviewed. No pertinent family history.  Family Psychiatric  History: See H&P  Social History:  Social History   Substance and Sexual Activity  Alcohol Use Not Currently  . Frequency: Never     Social History   Substance and Sexual Activity  Drug Use Not Currently    Social History   Socioeconomic History  . Marital status: Single    Spouse name: Not on file  . Number of children: Not on file  . Years of education: Not on file  . Highest education level: Not on file  Occupational History  . Not on file  Social Needs  . Financial resource strain: Not on file  . Food insecurity:    Worry: Not on file    Inability: Not on file  . Transportation needs:    Medical: Not on file    Non-medical: Not on file  Tobacco Use  . Smoking status: Current Every Day Smoker    Packs/day: 1.00    Types: Cigarettes  . Smokeless tobacco: Never Used  Substance and Sexual Activity  . Alcohol use: Not Currently    Frequency: Never  . Drug use: Not Currently  . Sexual activity: Not Currently  Lifestyle  . Physical activity:    Days per week: Not on  file    Minutes per session: Not on file  . Stress: Not on file  Relationships  . Social connections:    Talks on phone: Not on file    Gets together: Not on file    Attends religious service: Not on file    Active member of club or organization: Not on file    Attends meetings of clubs or organizations: Not on file    Relationship status: Not on file  Other Topics Concern  . Not on file  Social History Narrative  . Not on file   Hospital Course: (Per Md's discharge SRA): Frederick Fletcher is a 57 y/o M with history of schizophrenia who was admitted from [facility] ED where he presented brought by police after making SI statements about cutting himself to his landlady. He was medically cleared and then transferred to [facility] for additional treatment and evaluation. He was resumed on home medications of zyprexa, klonopin, and depakote. His depakote level was was found to be therapeutic. Pt reports he was staying in a group home in the [place] area but he does not recall the contact information. Pt has remained generally disorganized in his speech and thoughts, and he continues to endorse AH, but he has been denying SI.His dose of zyprexa has been increased during his stay, and his depakote was changed to be only twice per day (same total daily dose).Pt had improvement of his  presenting symptoms.  Besides the use of Depakote 500 mg & 1,000 mg respectively for mood stabilization, Zyprexa 20 mg for mood control, Jahmarion was also medicated & discharged on; Hydroxyzine 25 mg prn for anxiety, Nicorette gum 2 mg for smoking cessation & Trazodone 50 mg prn for insomnia. He was enrolled & participated in the group counseling sessions being offered & held on this unit. He learned coping skills. He presented no other significant pre-existing health issues that required treatment or monitoring. Tabius tolerated his treatment regimen without any adverse effects or reactions reported.  Today upon his discharge  evaluation,pt shares, "I'm doing alright." His speech remains somewhat disorganized and garbled, but pt is goal-directed and he is easily redirected when getting off topic. He denies any specific concerns. He shares that he is excited about being accepted for placement at a supervised living. He shares about things he plans to do after leaving the hospital such as "ride the bus by myself" and "read my Bible." He denies any physical complaints today. He is sleeping well. His appetite is good. He denies SI/HI/AH/VH. He is tolerating his medications well, and he is in agreement to continue his current regimen without changes. He plans to follow up at [facility] after discharge. He was able to engage in safety planning including plan to return to [facility] or contact emergency services if he feels unable to maintain his own safety or the safety of others. Pt had no further questions, comments, or concerns.  Upon discharge, Doyne presents mentally & medically stable. He will continue mental health care on outpatient basis as noted below. He is provided with all the necessary information needed to make this appointment without problems. [name] left [facility] with all personal belongings in no apparent distress. Transportation per Ms. Davis.  Physical Findings: AIMS: Facial and Oral Movements Muscles of Facial Expression: None, normal Lips and Perioral Area: None, normal Jaw: None, normal Tongue: None, normal,Extremity Movements Upper (arms, wrists, hands, fingers): None, normal Lower (legs, knees, ankles, toes): None, normal, Trunk Movements Neck, shoulders, hips: None, normal, Overall Severity Severity of abnormal movements (highest score from questions above): None, normal Incapacitation due to abnormal movements: None, normal Patient's awareness of abnormal movements (rate only patient's report): No Awareness, Dental Status Current problems with teeth and/or dentures?: No Does patient usually wear dentures?: No   CIWA:  CIWA-Ar Total: 1 COWS:  COWS Total Score: 2  Musculoskeletal: Strength & Muscle Tone: within normal limits Gait & Station: normal Patient leans: N/A  Psychiatric Specialty Exam: Physical Exam  Constitutional: He appears well-developed.  HENT:  Head: Normocephalic.  Eyes: Pupils are equal, round, and reactive to light.  Cardiovascular: Normal rate.  Respiratory: Effort normal.  GI: Soft.  Genitourinary:  Genitourinary Comments: Deferred  Musculoskeletal: Normal range of motion.  Neurological: He is alert.  Skin: Skin is warm.    Review of Systems  Constitutional: Negative.   HENT: Negative.   Eyes: Negative.   Respiratory: Negative.   Cardiovascular: Negative.   Gastrointestinal: Negative.   Genitourinary: Negative.   Musculoskeletal: Negative.   Skin: Negative.   Neurological: Negative.   Endo/Heme/Allergies: Negative.   Psychiatric/Behavioral: Positive for hallucinations (Hx. psychosis (Stabilized with medication prior to discharge)). Negative for depression, memory loss, substance abuse and suicidal ideas. The patient has insomnia (Stabilized with medication prior to discharge). The patient is not nervous/anxious.     Blood pressure 99/71, pulse 91, temperature 98.4 F (36.9 C), temperature source Oral, resp. rate 20, height 6' (1.829 m), weight  61.2 kg (135 lb).Body mass index is 18.31 kg/m.  See Md's SRA   Have you used any form of tobacco in the last 30 days? (Cigarettes, Smokeless Tobacco, Cigars, and/or Pipes): Yes  Has this patient used any form of tobacco in the last 30 days? (Cigarettes, Smokeless Tobacco, Cigars, and/or Pipes): Yes, an FDA-approved tobacco cessation medication was offered at discharge.  Blood Alcohol level:  Lab Results  Component Value Date   ETH <10 99/37/1696   Metabolic Disorder Labs:  Lab Results  Component Value Date   HGBA1C 6.5 (H) 12/26/2017   MPG 139.85 12/26/2017   MPG 131.24 12/25/2017   Lab Results  Component  Value Date   PROLACTIN 50.5 (H) 12/25/2017   Lab Results  Component Value Date   CHOL 146 12/25/2017   TRIG 64 12/25/2017   HDL 50 12/25/2017   CHOLHDL 2.9 12/25/2017   VLDL 13 12/25/2017   LDLCALC 83 12/25/2017   LDLCALC  01/20/2008    68        Total Cholesterol/HDL:CHD Risk Coronary Heart Disease Risk Table                     Men   Women  1/2 Average Risk   3.4   3.3   See Psychiatric Specialty Exam and Suicide Risk Assessment completed by Attending Physician prior to discharge.  Discharge destination:  Other:  Group home setting.  Is patient on multiple antipsychotic therapies at discharge:  No   Has Patient had three or more failed trials of antipsychotic monotherapy by history:  No  Recommended Plan for Multiple Antipsychotic Therapies: NA  Allergies as of 01/05/2018   No Known Allergies     Medication List    STOP taking these medications   benztropine 0.5 MG tablet Commonly known as:  COGENTIN   clonazePAM 0.5 MG tablet Commonly known as:  KLONOPIN   divalproex 500 MG 24 hr tablet Commonly known as:  DEPAKOTE ER Replaced by:  divalproex 500 MG DR tablet   ranitidine 300 MG tablet Commonly known as:  ZANTAC Replaced by:  famotidine 40 MG tablet     TAKE these medications     Indication  divalproex 500 MG DR tablet Commonly known as:  DEPAKOTE Take 1 tablet (500 mg total) by mouth every morning. (Take 1 tablet (500 mg) by mouth in the mornings & 2 tablets (1,000 mg) at bedtime: For mood stabilization Start taking on:  01/06/2018 Replaces:  divalproex 500 MG 24 hr tablet  Indication:  Mood stabilization   famotidine 40 MG tablet Commonly known as:  PEPCID Take 1 tablet (40 mg total) by mouth at bedtime. For acid reflux Replaces:  ranitidine 300 MG tablet  Indication:  Gastroesophageal Reflux Disease   hydrOXYzine 25 MG tablet Commonly known as:  ATARAX/VISTARIL Take 1 tablet (25 mg total) by mouth 3 (three) times daily as needed for anxiety.   Indication:  Feeling Anxious   nicotine polacrilex 2 MG gum Commonly known as:  NICORETTE Take 1 each (2 mg total) by mouth as needed for smoking cessation. (May buy from over the counter): For smoking cessation  Indication:  Nicotine Addiction   OLANZapine 20 MG tablet Commonly known as:  ZYPREXA Take 1 tablet (20 mg total) by mouth at bedtime. For mood control What changed:    when to take this  additional instructions  Indication:  Mood control   traZODone 50 MG tablet Commonly known as:  DESYREL Take 1 tablet (  50 mg total) by mouth at bedtime as needed for sleep.  Indication:  Trouble Sleeping      Follow-up Information    [facility] Follow up on 01/11/2018.   Specialty:  Behavioral Health Why:  Tuesday at 8:45 with Josph Macho for your hospital follow up appointment. TCT can help you with transportation to appointments and provide other supports.  Call Valerie at [phone] information: [place] [address] [phone]          Follow-up recommendations: Activity:  As tolerated Diet: As recommended by your primary care doctor. Keep all scheduled follow-up appointments as recommended.   Comments: Patient is instructed prior to discharge to: Take all medications as prescribed by his/her mental healthcare provider. Report any adverse effects and or reactions from the medicines to his/her outpatient provider promptly. Patient has been instructed & cautioned: To not engage in alcohol and or illegal drug use while on prescription medicines. In the event of worsening symptoms, patient is instructed to call the crisis hotline, 911 and or go to the nearest ED for appropriate evaluation and treatment of symptoms. To follow-up with his/her primary care provider for your other medical issues, concerns and or health care needs.   Signed: Lindell Spar, NP, PMHNP, FNP-BC 01/05/2018, 10:18 AM   Patient seen, Suicide Assessment Completed.  Disposition Plan  Reviewed

## 2018-01-05 NOTE — Plan of Care (Signed)
Patient attended and participated in recreational therapy group sessions.   Victorino Sparrow, LRT/CTRS

## 2018-01-05 NOTE — Progress Notes (Signed)
Recreation Therapy Notes  INPATIENT RECREATION TR PLAN  Patient Details Name: Frederick Fletcher MRN: [id number] DOB: Dec 15, 1960 Today's Date: 01/05/2018  Rec Therapy Plan Is patient appropriate for Therapeutic Recreation?: Yes Treatment times per week: about 3 days Estimated Length of Stay: 5-7 days TR Treatment/Interventions: Group participation (Comment)  Discharge Criteria Pt will be discharged from therapy if:: Discharged Treatment plan/goals/alternatives discussed and agreed upon by:: Patient/family  Discharge Summary Short term goals set: See patient care plan Short term goals met: Complete Progress toward goals comments: Groups attended Which groups?: Wellness, Coping skills, Anger management, Goal setting, Communication(Teambuilding) Reason goals not met: None Therapeutic equipment acquired: N/A Reason patient discharged from therapy: Discharge from hospital Pt/family agrees with progress & goals achieved: Yes Date patient discharged from therapy: 01/05/18    Victorino Sparrow, LRT/CTRS  Ria Comment, Thao Vanover A 01/05/2018, 1:44 PM

## 2018-01-11 DIAGNOSIS — F250 Schizoaffective disorder, bipolar type: Secondary | ICD-10-CM | POA: Diagnosis not present

## 2018-01-13 DIAGNOSIS — F250 Schizoaffective disorder, bipolar type: Secondary | ICD-10-CM | POA: Diagnosis not present

## 2018-01-19 DIAGNOSIS — F250 Schizoaffective disorder, bipolar type: Secondary | ICD-10-CM | POA: Diagnosis not present

## 2018-02-16 DIAGNOSIS — F250 Schizoaffective disorder, bipolar type: Secondary | ICD-10-CM | POA: Diagnosis not present

## 2018-03-16 DIAGNOSIS — F250 Schizoaffective disorder, bipolar type: Secondary | ICD-10-CM | POA: Diagnosis not present

## 2018-04-13 DIAGNOSIS — F250 Schizoaffective disorder, bipolar type: Secondary | ICD-10-CM | POA: Diagnosis not present

## 2018-04-26 DIAGNOSIS — F250 Schizoaffective disorder, bipolar type: Secondary | ICD-10-CM | POA: Diagnosis not present

## 2018-05-11 DIAGNOSIS — F250 Schizoaffective disorder, bipolar type: Secondary | ICD-10-CM | POA: Diagnosis not present

## 2018-05-18 DIAGNOSIS — K257 Chronic gastric ulcer without hemorrhage or perforation: Secondary | ICD-10-CM | POA: Diagnosis not present

## 2018-05-18 DIAGNOSIS — F250 Schizoaffective disorder, bipolar type: Secondary | ICD-10-CM | POA: Diagnosis not present

## 2018-06-07 DIAGNOSIS — F250 Schizoaffective disorder, bipolar type: Secondary | ICD-10-CM | POA: Diagnosis not present

## 2018-07-04 ENCOUNTER — Encounter (INDEPENDENT_AMBULATORY_CARE_PROVIDER_SITE_OTHER): Payer: Self-pay | Admitting: Physician Assistant

## 2018-07-04 ENCOUNTER — Other Ambulatory Visit: Payer: Self-pay

## 2018-07-04 ENCOUNTER — Ambulatory Visit (INDEPENDENT_AMBULATORY_CARE_PROVIDER_SITE_OTHER): Payer: Medicare Other | Admitting: Physician Assistant

## 2018-07-04 VITALS — BP 109/70 | HR 85 | Temp 97.5°F | Ht 75.0 in | Wt 149.4 lb

## 2018-07-04 DIAGNOSIS — Z23 Encounter for immunization: Secondary | ICD-10-CM | POA: Diagnosis not present

## 2018-07-04 DIAGNOSIS — E119 Type 2 diabetes mellitus without complications: Secondary | ICD-10-CM | POA: Diagnosis not present

## 2018-07-04 LAB — POCT GLYCOSYLATED HEMOGLOBIN (HGB A1C): Hemoglobin A1C: 7.8 % — AB (ref 4.0–5.6)

## 2018-07-04 MED ORDER — METFORMIN HCL ER 500 MG PO TB24: 1000.0000 mg | ORAL_TABLET | Freq: Every day | ORAL | 3 refills | Status: DC

## 2018-07-04 NOTE — Progress Notes (Signed)
Subjective:  Patient ID: Frederick Fletcher, male    DOB: [birth year]  Age: 57 y.o. MRN: [id number]  CC: establish care  HPI Frederick Fletcher is a 57 y.o. male with a medical history of DM2 and psychosis presents as a new patient to establish care. Lives in a group home and was seen by a visiting MD. Wendi Snipes an order for flu vaccine and Prevnar written by Dr. Ranae Palms. [place]. Pt states he feels well. Does not endorse any symptoms or complaints whatsoever.   *Pt is a poor historian and mentally impaired.       Outpatient Medications Prior to Visit  Medication Sig Dispense Refill  . divalproex (DEPAKOTE) 500 MG DR tablet Take 1 tablet (500 mg total) by mouth every morning. (Take 1 tablet (500 mg) by mouth in the mornings & 2 tablets (1,000 mg) at bedtime: For mood stabilization 90 tablet 0  . famotidine (PEPCID) 40 MG tablet Take 1 tablet (40 mg total) by mouth at bedtime. For acid reflux 30 tablet 0  . hydrOXYzine (ATARAX/VISTARIL) 25 MG tablet Take 1 tablet (25 mg total) by mouth 3 (three) times daily as needed for anxiety. 75 tablet 0  . nicotine polacrilex (NICORETTE) 2 MG gum Take 1 each (2 mg total) by mouth as needed for smoking cessation. (May buy from over the counter): For smoking cessation 100 tablet 0  . OLANZapine (ZYPREXA) 20 MG tablet Take 1 tablet (20 mg total) by mouth at bedtime. For mood control 30 tablet 0  . traZODone (DESYREL) 50 MG tablet Take 1 tablet (50 mg total) by mouth at bedtime as needed for sleep. 30 tablet 0   No facility-administered medications prior to visit.      ROS Review of Systems  Constitutional: Negative for chills, fever and malaise/fatigue.  Eyes: Negative for blurred vision.  Respiratory: Negative for shortness of breath.   Cardiovascular: Negative for chest pain and palpitations.  Gastrointestinal: Negative for abdominal pain and nausea.  Genitourinary: Negative for dysuria and hematuria.  Musculoskeletal: Negative for joint  pain and myalgias.  Skin: Negative for rash.  Neurological: Negative for tingling and headaches.  Psychiatric/Behavioral: Negative for depression. The patient is not nervous/anxious.     Objective:  BP 109/70 (BP Location: Left Arm, Patient Position: Sitting, Cuff Size: Normal)   Pulse 85   Temp (!) 97.5 F (36.4 C) (Oral)   Ht 6\' 3"  (1.905 m)   Wt 149 lb 6.4 oz (67.8 kg)   SpO2 96%   BMI 18.67 kg/m   BP/Weight 07/04/2018 12/24/2017 0/35/4656  Systolic BP 812 751 -  Diastolic BP 70 61 -  Wt. (Lbs) 149.4 - 158  BMI 18.67 - 19.75  Some encounter information is confidential and restricted. Go to Review Flowsheets activity to see all data.      Physical Exam  Constitutional: He is oriented to person, place, and time.  Well developed, well nourished, NAD, polite  HENT:  Head: Normocephalic and atraumatic.  Eyes: No scleral icterus.  Neck: Normal range of motion. Neck supple. No thyromegaly present.  Cardiovascular: Normal rate, regular rhythm and normal heart sounds.  Pulmonary/Chest: Effort normal and breath sounds normal.  Abdominal: Soft. Bowel sounds are normal. There is no tenderness.  Musculoskeletal: He exhibits no edema.  Neurological: He is alert and oriented to person, place, and time.  Skin: Skin is warm and dry. No rash noted. No erythema. No pallor.  Psychiatric: His behavior is normal. Thought content normal.  Tangential thought  process, pressured speech.  Vitals reviewed.    Assessment & Plan:   1. Type 2 diabetes mellitus without complication, without long-term current use of insulin (HCC) - HgB A1c 7.8% today. - Begin metFORMIN (GLUCOPHAGE XR) 500 MG 24 hr tablet; Take 2 tablets (1,000 mg total) by mouth daily with breakfast.  Dispense: 120 tablet; Refill: 3 - Comprehensive metabolic panel - Lipid panel  2. Need for prophylactic vaccination and inoculation against influenza - Flu Vaccine QUAD 6+ mos PF IM (Fluarix Quad PF)   Meds ordered this  encounter  Medications  . metFORMIN (GLUCOPHAGE XR) 500 MG 24 hr tablet    Sig: Take 2 tablets (1,000 mg total) by mouth daily with breakfast.    Dispense:  120 tablet    Refill:  3    Order Specific Question:   Supervising Provider    Answer:   Charlott Rakes [[id number]]    Follow-up: Return in about 3 months (around 10/03/2018) for diabetes.   Clent Demark PA

## 2018-07-04 NOTE — Patient Instructions (Signed)
Diabetes Mellitus and Nutrition When you have diabetes (diabetes mellitus), it is very important to have healthy eating habits because your blood sugar (glucose) levels are greatly affected by what you eat and drink. Eating healthy foods in the appropriate amounts, at about the same times every day, can help you:  Control your blood glucose.  Lower your risk of heart disease.  Improve your blood pressure.  Reach or maintain a healthy weight.  Every person with diabetes is different, and each person has different needs for a meal plan. Your health care provider may recommend that you work with a diet and nutrition specialist (dietitian) to make a meal plan that is best for you. Your meal plan may vary depending on factors such as:  The calories you need.  The medicines you take.  Your weight.  Your blood glucose, blood pressure, and cholesterol levels.  Your activity level.  Other health conditions you have, such as heart or kidney disease.  How do carbohydrates affect me? Carbohydrates affect your blood glucose level more than any other type of food. Eating carbohydrates naturally increases the amount of glucose in your blood. Carbohydrate counting is a method for keeping track of how many carbohydrates you eat. Counting carbohydrates is important to keep your blood glucose at a healthy level, especially if you use insulin or take certain oral diabetes medicines. It is important to know how many carbohydrates you can safely have in each meal. This is different for every person. Your dietitian can help you calculate how many carbohydrates you should have at each meal and for snack. Foods that contain carbohydrates include:  Bread, cereal, rice, pasta, and crackers.  Potatoes and corn.  Peas, beans, and lentils.  Milk and yogurt.  Fruit and juice.  Desserts, such as cakes, cookies, ice cream, and candy.  How does alcohol affect me? Alcohol can cause a sudden decrease in blood  glucose (hypoglycemia), especially if you use insulin or take certain oral diabetes medicines. Hypoglycemia can be a life-threatening condition. Symptoms of hypoglycemia (sleepiness, dizziness, and confusion) are similar to symptoms of having too much alcohol. If your health care provider says that alcohol is safe for you, follow these guidelines:  Limit alcohol intake to no more than 1 drink per day for nonpregnant women and 2 drinks per day for men. One drink equals 12 oz of beer, 5 oz of wine, or 1 oz of hard liquor.  Do not drink on an empty stomach.  Keep yourself hydrated with water, diet soda, or unsweetened iced tea.  Keep in mind that regular soda, juice, and other mixers may contain a lot of sugar and must be counted as carbohydrates.  What are tips for following this plan? Reading food labels  Start by checking the serving size on the label. The amount of calories, carbohydrates, fats, and other nutrients listed on the label are based on one serving of the food. Many foods contain more than one serving per package.  Check the total grams (g) of carbohydrates in one serving. You can calculate the number of servings of carbohydrates in one serving by dividing the total carbohydrates by 15. For example, if a food has 30 g of total carbohydrates, it would be equal to 2 servings of carbohydrates.  Check the number of grams (g) of saturated and trans fats in one serving. Choose foods that have low or no amount of these fats.  Check the number of milligrams (mg) of sodium in one serving. Most people   should limit total sodium intake to less than 2,300 mg per day.  Always check the nutrition information of foods labeled as "low-fat" or "nonfat". These foods may be higher in added sugar or refined carbohydrates and should be avoided.  Talk to your dietitian to identify your daily goals for nutrients listed on the label. Shopping  Avoid buying canned, premade, or processed foods. These  foods tend to be high in fat, sodium, and added sugar.  Shop around the outside edge of the grocery store. This includes fresh fruits and vegetables, bulk grains, fresh meats, and fresh dairy. Cooking  Use low-heat cooking methods, such as baking, instead of high-heat cooking methods like deep frying.  Cook using healthy oils, such as olive, canola, or sunflower oil.  Avoid cooking with butter, cream, or high-fat meats. Meal planning  Eat meals and snacks regularly, preferably at the same times every day. Avoid going long periods of time without eating.  Eat foods high in fiber, such as fresh fruits, vegetables, beans, and whole grains. Talk to your dietitian about how many servings of carbohydrates you can eat at each meal.  Eat 4-6 ounces of lean protein each day, such as lean meat, chicken, fish, eggs, or tofu. 1 ounce is equal to 1 ounce of meat, chicken, or fish, 1 egg, or 1/4 cup of tofu.  Eat some foods each day that contain healthy fats, such as avocado, nuts, seeds, and fish. Lifestyle   Check your blood glucose regularly.  Exercise at least 30 minutes 5 or more days each week, or as told by your health care provider.  Take medicines as told by your health care provider.  Do not use any products that contain nicotine or tobacco, such as cigarettes and e-cigarettes. If you need help quitting, ask your health care provider.  Work with a counselor or diabetes educator to identify strategies to manage stress and any emotional and social challenges. What are some questions to ask my health care provider?  Do I need to meet with a diabetes educator?  Do I need to meet with a dietitian?  What number can I call if I have questions?  When are the best times to check my blood glucose? Where to find more information:  American Diabetes Association: diabetes.org/food-and-fitness/food  Academy of Nutrition and Dietetics:  www.eatright.org/resources/health/diseases-and-conditions/diabetes  National Institute of Diabetes and Digestive and Kidney Diseases (NIH): www.niddk.nih.gov/health-information/diabetes/overview/diet-eating-physical-activity Summary  A healthy meal plan will help you control your blood glucose and maintain a healthy lifestyle.  Working with a diet and nutrition specialist (dietitian) can help you make a meal plan that is best for you.  Keep in mind that carbohydrates and alcohol have immediate effects on your blood glucose levels. It is important to count carbohydrates and to use alcohol carefully. This information is not intended to replace advice given to you by your health care provider. Make sure you discuss any questions you have with your health care provider. Document Released: 04/09/2005 Document Revised: 08/17/2016 Document Reviewed: 08/17/2016 Elsevier Interactive Patient Education  2018 Elsevier Inc.  

## 2018-07-05 DIAGNOSIS — F250 Schizoaffective disorder, bipolar type: Secondary | ICD-10-CM | POA: Diagnosis not present

## 2018-07-05 LAB — COMPREHENSIVE METABOLIC PANEL
ALT: 11 IU/L (ref 0–44)
AST: 19 IU/L (ref 0–40)
Albumin/Globulin Ratio: 1.1 — ABNORMAL LOW (ref 1.2–2.2)
Albumin: 4 g/dL (ref 3.5–5.5)
Alkaline Phosphatase: 76 IU/L (ref 39–117)
BILIRUBIN TOTAL: 0.3 mg/dL (ref 0.0–1.2)
BUN/Creatinine Ratio: 13 (ref 9–20)
BUN: 14 mg/dL (ref 6–24)
CALCIUM: 9.9 mg/dL (ref 8.7–10.2)
CHLORIDE: 100 mmol/L (ref 96–106)
CO2: 23 mmol/L (ref 20–29)
Creatinine, Ser: 1.09 mg/dL (ref 0.76–1.27)
GFR calc non Af Amer: 74 mL/min/{1.73_m2} (ref >59)
GFR, EST AFRICAN AMERICAN: 86 mL/min/{1.73_m2} (ref >59)
GLUCOSE: 129 mg/dL — AB (ref 65–99)
Globulin, Total: 3.7 g/dL (ref 1.5–4.5)
Potassium: 4.3 mmol/L (ref 3.5–5.2)
Sodium: 141 mmol/L (ref 134–144)
TOTAL PROTEIN: 7.7 g/dL (ref 6.0–8.5)

## 2018-07-05 LAB — LIPID PANEL
Chol/HDL Ratio: 3.8 ratio (ref 0.0–5.0)
Cholesterol, Total: 147 mg/dL (ref 100–199)
HDL: 39 mg/dL — AB (ref >39)
LDL Calculated: 92 mg/dL (ref 0–99)
Triglycerides: 79 mg/dL (ref 0–149)
VLDL Cholesterol Cal: 16 mg/dL (ref 5–40)

## 2018-07-07 ENCOUNTER — Telehealth (INDEPENDENT_AMBULATORY_CARE_PROVIDER_SITE_OTHER): Payer: Self-pay

## 2018-07-07 DIAGNOSIS — F250 Schizoaffective disorder, bipolar type: Secondary | ICD-10-CM | POA: Diagnosis not present

## 2018-07-07 DIAGNOSIS — E118 Type 2 diabetes mellitus with unspecified complications: Secondary | ICD-10-CM | POA: Diagnosis not present

## 2018-07-07 DIAGNOSIS — D400 Neoplasm of uncertain behavior of prostate: Secondary | ICD-10-CM | POA: Diagnosis not present

## 2018-07-07 NOTE — Telephone Encounter (Signed)
Left voicemail asking to return call to [facility] at [phone]. Nat Christen, CMA

## 2018-07-07 NOTE — Telephone Encounter (Signed)
-----   Message from Clent Demark, PA-C sent at 07/05/2018  8:35 AM EST ----- Results normal except for elevated glucose. Take medications as directed.

## 2018-07-12 ENCOUNTER — Encounter (INDEPENDENT_AMBULATORY_CARE_PROVIDER_SITE_OTHER): Payer: Self-pay

## 2018-07-12 ENCOUNTER — Telehealth (INDEPENDENT_AMBULATORY_CARE_PROVIDER_SITE_OTHER): Payer: Self-pay

## 2018-07-12 NOTE — Telephone Encounter (Signed)
-----   Message from Clent Demark, PA-C sent at 07/05/2018  8:35 AM EST ----- Results normal except for elevated glucose. Take medications as directed.

## 2018-07-12 NOTE — Telephone Encounter (Signed)
Second attempt to reach patient. Results mailed. Frederick Fletcher

## 2018-08-03 DIAGNOSIS — F250 Schizoaffective disorder, bipolar type: Secondary | ICD-10-CM | POA: Diagnosis not present

## 2018-08-31 DIAGNOSIS — F250 Schizoaffective disorder, bipolar type: Secondary | ICD-10-CM | POA: Diagnosis not present

## 2018-09-29 DIAGNOSIS — F250 Schizoaffective disorder, bipolar type: Secondary | ICD-10-CM | POA: Diagnosis not present

## 2018-10-14 DIAGNOSIS — E118 Type 2 diabetes mellitus with unspecified complications: Secondary | ICD-10-CM | POA: Diagnosis not present

## 2018-10-14 DIAGNOSIS — F250 Schizoaffective disorder, bipolar type: Secondary | ICD-10-CM | POA: Diagnosis not present

## 2018-10-14 DIAGNOSIS — K257 Chronic gastric ulcer without hemorrhage or perforation: Secondary | ICD-10-CM | POA: Diagnosis not present

## 2018-10-14 DIAGNOSIS — D400 Neoplasm of uncertain behavior of prostate: Secondary | ICD-10-CM | POA: Diagnosis not present

## 2018-10-26 DIAGNOSIS — F250 Schizoaffective disorder, bipolar type: Secondary | ICD-10-CM | POA: Diagnosis not present

## 2018-10-27 DIAGNOSIS — F250 Schizoaffective disorder, bipolar type: Secondary | ICD-10-CM | POA: Diagnosis not present

## 2018-11-29 DIAGNOSIS — F250 Schizoaffective disorder, bipolar type: Secondary | ICD-10-CM | POA: Diagnosis not present

## 2019-01-20 DIAGNOSIS — E118 Type 2 diabetes mellitus with unspecified complications: Secondary | ICD-10-CM | POA: Diagnosis not present

## 2019-01-20 DIAGNOSIS — F250 Schizoaffective disorder, bipolar type: Secondary | ICD-10-CM | POA: Diagnosis not present

## 2019-01-26 DIAGNOSIS — F250 Schizoaffective disorder, bipolar type: Secondary | ICD-10-CM | POA: Diagnosis not present

## 2019-02-02 DIAGNOSIS — F250 Schizoaffective disorder, bipolar type: Secondary | ICD-10-CM | POA: Diagnosis not present

## 2019-02-23 DIAGNOSIS — F250 Schizoaffective disorder, bipolar type: Secondary | ICD-10-CM | POA: Diagnosis not present

## 2019-04-20 DIAGNOSIS — F250 Schizoaffective disorder, bipolar type: Secondary | ICD-10-CM | POA: Diagnosis not present

## 2019-05-01 DIAGNOSIS — F250 Schizoaffective disorder, bipolar type: Secondary | ICD-10-CM | POA: Diagnosis not present

## 2019-05-12 DIAGNOSIS — Z23 Encounter for immunization: Secondary | ICD-10-CM | POA: Diagnosis not present

## 2019-05-12 DIAGNOSIS — F250 Schizoaffective disorder, bipolar type: Secondary | ICD-10-CM | POA: Diagnosis not present

## 2020-03-13 ENCOUNTER — Encounter: Payer: Self-pay | Admitting: Cardiology

## 2020-03-13 ENCOUNTER — Ambulatory Visit: Payer: Medicare Other

## 2020-03-13 ENCOUNTER — Other Ambulatory Visit: Payer: Self-pay

## 2020-03-13 ENCOUNTER — Ambulatory Visit: Payer: Medicare Other | Admitting: Cardiology

## 2020-03-13 VITALS — BP 110/80 | HR 75 | Resp 17 | Ht 72.0 in | Wt 137.0 lb

## 2020-03-13 DIAGNOSIS — E1165 Type 2 diabetes mellitus with hyperglycemia: Secondary | ICD-10-CM

## 2020-03-13 DIAGNOSIS — F250 Schizoaffective disorder, bipolar type: Secondary | ICD-10-CM

## 2020-03-13 DIAGNOSIS — I491 Atrial premature depolarization: Secondary | ICD-10-CM

## 2020-03-13 DIAGNOSIS — F17210 Nicotine dependence, cigarettes, uncomplicated: Secondary | ICD-10-CM

## 2020-03-13 DIAGNOSIS — R9431 Abnormal electrocardiogram [ECG] [EKG]: Secondary | ICD-10-CM

## 2020-03-13 NOTE — Progress Notes (Signed)
Date:  03/13/2020   ID:  Kallie Locks Washabaugh, DOB 1961/04/07, MRN [id number]  PCP:  Clent Demark, PA-C  Cardiologist:  Rex Kras, DO, [facility] (established care 03/13/2020)  REASON FOR CONSULT: Hypertension  REQUESTING PHYSICIAN:  Roslyn Smiling, MD [address] [place],  [place] [address]  Chief Complaint  Patient presents with   Hypertension   New Patient (Initial Visit)    HPI  Frederick Fletcher is a 59 y.o. male who presents to the office with a chief complaint of " high blood pressure." Patient's past medical history and cardiovascular risk factors include: schizoaffective disorder, type 2 diabetes mellitus, active cigarette smoking, former marijuana use.  Patient is referred to the office at the request of his primary care for evaluation of high blood pressure as per the referral and premature ventricular contractions as per office note.  Patient is a pleasant gentleman but a poor historian given his underlying schizoaffective disorder.  He is unable to provide an accurate history of present illness.    He denies any chest pain, shortness of breath at rest or with effort related activities.  Patient tries to be physically active on a daily basis.  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   At today's office visit his initial blood pressures were in the mid 90 mmHg range and on recheck systolic blood pressure was 110.  I do not have a log of blood pressures to see if he truly has hypertension.  He is recommended a log of his blood pressure office visit.  Review of outside records notes that he was noted to have premature ventricular contractions with appears to be on auscultation.  EKG at today's office visit shows underlying normal sinus rhythm with rare PACs.  Patient appears to be asymptomatic.  He denies being lightheaded, dizzy, no episodes of near syncope or syncope.  FUNCTIONAL STATUS: He walks about  1 hr per day.   ALLERGIES: No Known Allergies  MEDICATION LIST PRIOR TO VISIT: Current Meds  Medication Sig   benztropine (COGENTIN) 0.5 MG tablet Take 1 tablet by mouth 2 (two) times daily.   divalproex (DEPAKOTE) 500 MG DR tablet Take 1 tablet (500 mg total) by mouth every morning. (Take 1 tablet (500 mg) by mouth in the mornings & 2 tablets (1,000 mg) at bedtime: For mood stabilization   metFORMIN (GLUCOPHAGE XR) 500 MG 24 hr tablet Take 2 tablets (1,000 mg total) by mouth daily with breakfast.   OLANZapine (ZYPREXA) 20 MG tablet Take 1 tablet (20 mg total) by mouth at bedtime. For mood control   paliperidone (INVEGA SUSTENNA) 234 MG/1.5ML SUSY injection      PAST MEDICAL HISTORY: Past Medical History:  Diagnosis Date   Chronic gastric ulcer    Diabetes mellitus without complication ([place])    Schizoaffective disorder ([place])     PAST SURGICAL HISTORY: History reviewed. No pertinent surgical history.  FAMILY HISTORY: The patient family history includes Cancer in his father; Stroke in his mother.  SOCIAL HISTORY:  The patient  reports that he has been smoking cigarettes. He has been smoking about 1.00 pack per day. He has never used smokeless tobacco. He reports previous alcohol use. He reports previous drug use. Drug: Marijuana.  REVIEW OF SYSTEMS: Review of Systems  Constitutional: Negative for chills and fever.  HENT: Negative for hoarse voice and nosebleeds.   Eyes: Negative for discharge, double vision  and pain.  Cardiovascular: Negative for chest pain, claudication, dyspnea on exertion, leg swelling, near-syncope, orthopnea, palpitations, paroxysmal nocturnal dyspnea and syncope.  Respiratory: Negative for hemoptysis and shortness of breath.   Musculoskeletal: Negative for muscle cramps and myalgias.  Gastrointestinal: Negative for abdominal pain, constipation, diarrhea, hematemesis, hematochezia, melena, nausea and vomiting.  Neurological: Negative for dizziness  and light-headedness.    PHYSICAL EXAM: Vitals with BMI 03/13/2020 07/04/2018 12/24/2017  Height 6' 0"  6' 3"  -  Weight 137 lbs 149 lbs 6 oz -  BMI 16.10 96.04 -  Systolic 540 981 191  Diastolic 80 70 61  Pulse 75 85 65  Some encounter information is confidential and restricted. Go to Review Flowsheets activity to see all data.    CONSTITUTIONAL: Pleasant age-appropriate gentleman.  Hemodynamically stable, no acute distress.   SKIN: Skin is warm and dry. No rash noted. No cyanosis. No pallor. No jaundice HEAD: Normocephalic and atraumatic.  EYES: No scleral icterus MOUTH/THROAT: Moist oral membranes.  Poor dental hygiene NECK: No JVD present. No thyromegaly noted. No carotid bruits  LYMPHATIC: No visible cervical adenopathy.  CHEST Normal respiratory effort. No intercostal retractions  LUNGS: Clear to auscultation bilaterally.  no stridor. No wheezes. No rales.  CARDIOVASCULAR:  variable S1-S2, skipped heartbeats, no gallops rubs or murmurs appreciated ABDOMINAL: No apparent ascites.  EXTREMITIES: No peripheral edema  HEMATOLOGIC: No significant bruising NEUROLOGIC: Oriented to person, place, and time. Nonfocal. Normal muscle tone.  PSYCHIATRIC: Pleasant gentleman, at times laughing hysterically, degree of pressured speech, noncombative and cooperative.    CARDIAC DATABASE: EKG: 03/13/2020: Normal sinus rhythm, 98 bpm, normal axis, combined atrial enlargement, IRBBB, TWI in inferior lateral leads suggestive of possible inferolateral ischemia, PACs.  No prior EKGs for comparison.  Echocardiogram: NA  Stress Testing: NA  Heart Catheterization: None  LABORATORY DATA: CBC Latest Ref Rng & Units 12/22/2017 01/25/2008 01/24/2008  WBC 4.0 - 10.5 K/uL 5.6 5.6 3.9(L)  Hemoglobin 13.0 - 17.0 g/dL 12.0(L) 11.5(L) 11.4(L)  Hematocrit 39 - 52 % 35.5(L) 34.4(L) 33.0(L)  Platelets 150 - 400 K/uL 142(L) 125 RESULT REPEATED AND VERIFIED(L) 86(L)    CMP Latest Ref Rng & Units 07/04/2018  12/22/2017 01/25/2008  Glucose 65 - 99 mg/dL 129(H) 116(H) 140(H)  BUN 6 - 24 mg/dL 14 16 9   Creatinine 0.76 - 1.27 mg/dL 1.09 1.04 0.92  Sodium 134 - 144 mmol/L 141 134(L) 139  Potassium 3.5 - 5.2 mmol/L 4.3 4.2 3.7 REPEATED TO VERIFY DELTA CHECK NOTED  Chloride 96 - 106 mmol/L 100 99(L) 105  CO2 20 - 29 mmol/L 23 29 27   Calcium 8.7 - 10.2 mg/dL 9.9 9.3 8.9  Total Protein 6.0 - 8.5 g/dL 7.7 7.4 -  Total Bilirubin 0.0 - 1.2 mg/dL 0.3 0.4 -  Alkaline Phos 39 - 117 IU/L 76 61 -  AST 0 - 40 IU/L 19 18 -  ALT 0 - 44 IU/L 11 11(L) -    Lipid Panel     Component Value Date/Time   CHOL 147 07/04/2018 1518   TRIG 79 07/04/2018 1518   HDL 39 (L) 07/04/2018 1518   CHOLHDL 3.8 07/04/2018 1518   CHOLHDL 2.9 12/25/2017 0623   VLDL 13 12/25/2017 0623   LDLCALC 92 07/04/2018 1518   LABVLDL 16 07/04/2018 1518    No components found for: NTPROBNP No results for input(s): PROBNP in the last 8760 hours. No results for input(s): TSH in the last 8760 hours.  BMP No results for input(s): NA, K, CL, CO2, GLUCOSE,  BUN, CREATININE, CALCIUM, GFRNONAA, GFRAA in the last 8760 hours.  HEMOGLOBIN A1C Lab Results  Component Value Date   HGBA1C 7.8 (A) 07/04/2018   MPG 139.85 12/26/2017    External Labs: Collected: 11/02/2019 Creatinine 1.32 mg/dL. eGFR: >28m/min per 1.73 m Lipid profile: Total cholesterol 120, triglycerides 87, HDL 36, LDL 67, non-HDL 84 Hemoglobin A1c: 7.6 TSH: 2.5   IMPRESSION:    ICD-10-CM   1. Premature atrial complexes  I49.1 EKG 12-Lead    PCV ECHOCARDIOGRAM COMPLETE    LONG TERM MONITOR (3-14 DAYS)  2. Cigarette smoker  F17.210   3. Schizoaffective disorder, bipolar type ([place]  F25.0   4. Type 2 diabetes mellitus with hyperglycemia, without long-term current use of insulin (HCC)  E11.65   5. Nonspecific abnormal electrocardiogram (ECG) (EKG)  R94.31 PCV MYOCARDIAL PERFUSION WO LEXISCAN     RECOMMENDATIONS: Frederick KOSIKis a 59y.o. male whose past medical  history and cardiac risk factors include: schizoaffective disorder, type 2 diabetes mellitus, active cigarette smoking, former marijuana use.  Premature atrial contractions:  Patient is referred to the office for possible PVC evaluation.  I do not have the EKG that notes recorded PVCs.  In today's EKG shows premature atrial contractions.  The degree of premature atrial contractions appear to be rare on surface EKG.  We will obtain a 7-day extended Holter monitor to evaluate for underlying dysrhythmias.  Echocardiogram will be ordered to evaluate for structural heart disease and left ventricular systolic function.  Abnormal EKG:  Patient is not the best historian and denies any chest pain or shortness of breath at rest or with effort later activities.   EKG shows ST-T changes in the inferolateral leads suggestive of possible ischemia.  Plan exercise nuclear stress test to evaluate for reversible ischemia.  Cigarette smoking: Educated importance of complete smoking cessation.  Type 2 diabetes mellitus: Most recent hemoglobin A1c reviewed.  Currently managed by PCP.  Schizoaffective disorder: Managed by primary team.  Patient was also referred to the office for blood pressure management.  At the office visit his initial blood pressure was in the mid 90 millimeters of mercury range and upon rechecking the systolic blood pressure was 110 mmHg.  I do not have a log of his blood pressures in an ambulatory setting to review at today's office visit.  He is asked to invest in a blood pressure cuff and to keep a log and to bring it in at the next office visit.  Would appreciate primary care's help in coordinating his care given his underlying schizoaffective disorder.   Independently reviewed the results of the labs and office notes sent in by his PCP.  FINAL MEDICATION LIST END OF ENCOUNTER: No orders of the defined types were placed in this encounter.    Current Outpatient Medications:     benztropine (COGENTIN) 0.5 MG tablet, Take 1 tablet by mouth 2 (two) times daily., Disp: , Rfl:    divalproex (DEPAKOTE) 500 MG DR tablet, Take 1 tablet (500 mg total) by mouth every morning. (Take 1 tablet (500 mg) by mouth in the mornings & 2 tablets (1,000 mg) at bedtime: For mood stabilization, Disp: 90 tablet, Rfl: 0   metFORMIN (GLUCOPHAGE XR) 500 MG 24 hr tablet, Take 2 tablets (1,000 mg total) by mouth daily with breakfast., Disp: 120 tablet, Rfl: 3   OLANZapine (ZYPREXA) 20 MG tablet, Take 1 tablet (20 mg total) by mouth at bedtime. For mood control, Disp: 30 tablet, Rfl: 0   paliperidone (  INVEGA SUSTENNA) 234 MG/1.5ML SUSY injection, , Disp: , Rfl:   Orders Placed This Encounter  Procedures   LONG TERM MONITOR (3-14 DAYS)   PCV MYOCARDIAL PERFUSION WO LEXISCAN   EKG 12-Lead   PCV ECHOCARDIOGRAM COMPLETE    There are no Patient Instructions on file for this visit.   --Continue cardiac medications as reconciled in final medication list. --Return in about 6 weeks (around 04/24/2020) for review test results (monitor, echo, and stress test). . Or sooner if needed. --Continue follow-up with your primary care physician regarding the management of your other chronic comorbid conditions.  Patient's questions and concerns were addressed to his satisfaction. He voices understanding of the instructions provided during this encounter.   This note was created using a voice recognition software as a result there may be grammatical errors inadvertently enclosed that do not reflect the nature of this encounter. Every attempt is made to correct such errors.  Rex Kras, [place], [facility]  Pager: [phone] Office: [phone]

## 2020-03-22 ENCOUNTER — Ambulatory Visit: Payer: Medicare Other

## 2020-03-22 ENCOUNTER — Other Ambulatory Visit: Payer: Self-pay

## 2020-03-22 DIAGNOSIS — I491 Atrial premature depolarization: Secondary | ICD-10-CM

## 2020-03-25 ENCOUNTER — Other Ambulatory Visit: Payer: Self-pay

## 2020-03-25 ENCOUNTER — Ambulatory Visit: Payer: Medicare Other

## 2020-03-25 DIAGNOSIS — R9431 Abnormal electrocardiogram [ECG] [EKG]: Secondary | ICD-10-CM

## 2020-03-29 ENCOUNTER — Encounter: Payer: Self-pay | Admitting: Cardiology

## 2020-03-29 ENCOUNTER — Other Ambulatory Visit: Payer: Self-pay

## 2020-03-29 ENCOUNTER — Ambulatory Visit: Payer: Medicare Other | Admitting: Cardiology

## 2020-03-29 VITALS — BP 103/47 | HR 58 | Resp 16 | Ht 72.0 in | Wt 137.0 lb

## 2020-03-29 DIAGNOSIS — I493 Ventricular premature depolarization: Secondary | ICD-10-CM

## 2020-03-29 DIAGNOSIS — F17210 Nicotine dependence, cigarettes, uncomplicated: Secondary | ICD-10-CM

## 2020-03-29 DIAGNOSIS — F250 Schizoaffective disorder, bipolar type: Secondary | ICD-10-CM

## 2020-03-29 DIAGNOSIS — Z712 Person consulting for explanation of examination or test findings: Secondary | ICD-10-CM

## 2020-03-29 DIAGNOSIS — I471 Supraventricular tachycardia: Secondary | ICD-10-CM

## 2020-03-29 DIAGNOSIS — E1165 Type 2 diabetes mellitus with hyperglycemia: Secondary | ICD-10-CM

## 2020-03-29 DIAGNOSIS — I483 Typical atrial flutter: Secondary | ICD-10-CM

## 2020-03-29 MED ORDER — ASPIRIN EC 81 MG PO TBEC: 81.0000 mg | DELAYED_RELEASE_TABLET | Freq: Every day | ORAL | 3 refills | Status: AC

## 2020-03-29 MED ORDER — METOPROLOL SUCCINATE ER 50 MG PO TB24: 50.0000 mg | ORAL_TABLET | Freq: Every day | ORAL | 0 refills | Status: DC

## 2020-03-29 NOTE — Progress Notes (Signed)
Date:  03/31/2020   ID:  Frederick Fletcher, DOB 1960/10/13, MRN [id number]  PCP:  Roslyn Smiling, MD  Cardiologist:  Rex Kras, DO, [facility] (established care 03/13/2020)  Date: 03/29/2020 Last Office Visit: 03/13/2020  Chief Complaint  Patient presents with  . Results    Monitor, Echocardiogram, Stress Test  . Follow-up    6 week    HPI  Frederick Fletcher is a 59 y.o. male who presents to the office with a chief complaint of " review test results." Patient's past medical history and cardiovascular risk factors include: schizoaffective disorder, type 2 diabetes mellitus, active cigarette smoking, former marijuana use, symptomatic supraventricular tachycardia, paroxysmal atrial flutter.  Patient is referred to the office at the request of his primary care for evaluation of high blood pressure and premature ventricular contractions.  Patient is a pleasant gentleman but a poor historian given his underlying schizoaffective disorder.  I have asked him to be accompanied by his next of kin, guardian, or if somebody has his [place].  However, patient presents today with his caregiver Frederick Fletcher but does not have his guardianship.  Patient states that both his brother Frederick Fletcher and Sister Frederick Fletcher live in [place] and he has been here in [place] for the last 15 to 20 years.  At the last office visit patient was recommended to undergo 7-day extended Holter monitor to evaluate for underlying PVC and PAC burden.  The Holter monitor results were reviewed with the patient at today's office visit.  Patient's predominant rhythm is normal sinus but he had a very short burst of atrial flutter approximately 4 minutes and 40 seconds.  He also had multiple SVT episodes some of which were symptomatic.  And his total ventricular ectopic burden is approximately 19.7%.  In addition, his prior EKG noted ST-T changes and therefore ischemic evaluation was recommended.  He was initially scheduled for a  treadmill exercise stress test but when he came for the visit he was found to be in atrial flutter.  The study was converted to [facility] which noted normal perfusion but overall intermediate risk study due to reduced LVEF per gated SPECT.  However, per echocardiogram patient is LVEF is preserved, left atrium is severely dilated, and moderate MR.    FUNCTIONAL STATUS: He walks about 1 hr per day.   ALLERGIES: No Known Allergies  MEDICATION LIST PRIOR TO VISIT: Current Meds  Medication Sig  . benztropine (COGENTIN) 0.5 MG tablet Take 1 tablet by mouth 2 (two) times daily.  . divalproex (DEPAKOTE) 500 MG DR tablet Take 1 tablet (500 mg total) by mouth every morning. (Take 1 tablet (500 mg) by mouth in the mornings & 2 tablets (1,000 mg) at bedtime: For mood stabilization  . metFORMIN (GLUCOPHAGE XR) 500 MG 24 hr tablet Take 2 tablets (1,000 mg total) by mouth daily with breakfast.  . OLANZapine (ZYPREXA) 20 MG tablet Take 1 tablet (20 mg total) by mouth at bedtime. For mood control     PAST MEDICAL HISTORY: Past Medical History:  Diagnosis Date  . Atrial flutter, paroxysmal ([place])    <2mn on holter monitor (02/2020)  . Chronic gastric ulcer   . Diabetes mellitus without complication ([place]   . Paroxysmal SVT (supraventricular tachycardia) ([place]   . PVC (premature ventricular contraction)   . Schizoaffective disorder ([place]     PAST SURGICAL HISTORY: History reviewed. No pertinent surgical history.  FAMILY HISTORY: The patient family history includes Cancer in his father; Stroke in his mother.  SOCIAL HISTORY:  The patient  reports that he has quit smoking. His smoking use included cigarettes. He smoked 1.00 pack per day. He has never used smokeless tobacco. He reports previous alcohol use. He reports previous drug use. Drug: Marijuana.  REVIEW OF SYSTEMS: Review of Systems  Constitutional: Negative for chills and fever.  HENT: Negative for hoarse voice and nosebleeds.   Eyes:  Negative for discharge, double vision and pain.  Cardiovascular: Negative for chest pain, claudication, dyspnea on exertion, leg swelling, near-syncope, orthopnea, palpitations, paroxysmal nocturnal dyspnea and syncope.  Respiratory: Negative for hemoptysis and shortness of breath.   Musculoskeletal: Negative for muscle cramps and myalgias.  Gastrointestinal: Negative for abdominal pain, constipation, diarrhea, hematemesis, hematochezia, melena, nausea and vomiting.  Neurological: Negative for dizziness and light-headedness.    PHYSICAL EXAM: Vitals with BMI 03/29/2020 03/13/2020 07/04/2018  Height 6' 0"  6' 0"  6' 3"   Weight 137 lbs 137 lbs 149 lbs 6 oz  BMI 18.58 80.22 33.61  Systolic 224 497 530  Diastolic 47 80 70  Pulse 58 75 85  Some encounter information is confidential and restricted. Go to Review Flowsheets activity to see all data.    CONSTITUTIONAL: Pleasant age-appropriate gentleman.  Hemodynamically stable, no acute distress.   SKIN: Skin is warm and dry. No rash noted. No cyanosis. No pallor. No jaundice HEAD: Normocephalic and atraumatic.  EYES: No scleral icterus MOUTH/THROAT: Moist oral membranes.  Poor dental hygiene NECK: No JVD present. No thyromegaly noted. No carotid bruits  LYMPHATIC: No visible cervical adenopathy.  CHEST Normal respiratory effort. No intercostal retractions  LUNGS: Clear to auscultation bilaterally.  no stridor. No wheezes. No rales.  CARDIOVASCULAR: Regular, S1-S2, skipped heartbeats, soft holosystolic murmur heard at the apex, no gallops or rubs. ABDOMINAL: No apparent ascites.  EXTREMITIES: No peripheral edema  HEMATOLOGIC: No significant bruising NEUROLOGIC: Oriented to person, place, and time. Nonfocal. Normal muscle tone.  PSYCHIATRIC: Pleasant gentleman, at times laughing hysterically, degree of pressured speech, noncombative and cooperative.    CARDIAC DATABASE: EKG: 03/13/2020: Normal sinus rhythm, 98 bpm, normal axis, combined atrial  enlargement, IRBBB, TWI in inferior lateral leads suggestive of possible inferolateral ischemia, PACs.  No prior EKGs for comparison.  Echocardiogram: 03/22/2020:  Left ventricle cavity is normal in size and wall thickness. Normal global wall motion. Frequent ectopy seen. Normal LV systolic function with EF 61%. Indeterminate diastolic filling pattern.  Left atrial cavity is severely dilated. Etiology is unclear.  Moderate (Grade II) mitral regurgitation.  Inadequate TR jet to estimate pulmonary artery systolic pressure. Estimated RA pressure 8 mmHg.  Stress Testing: Lexiscan Tetrofosmin Stress Test  03/25/2020: Stress changed from exercise tetrofosmin to Lexiscan in view of AFL with RVR.  Nondiagnostic ECG stress. Resting EKG  demonstrated atrial flutter with rapid ventricular response with 2:1 conduction. Non-specific ST-T abnormality. Peak EKG demonstrated atrial flutter with rapid ventricular response. No significant ST-T change from baseline abnormality. Myocardial perfusion is normal. Overall LV systolic function is abnormal without regional wall motion abnormalities. Stress LV EF: 38%. LVEF calculation may be affected by difficulty in gating with AFL with RVR. Intermediate risk study due to reduced LVEF. Consider non-ischemic cardiomyopathy.  No previous exam available for comparison.  Heart Catheterization: None  7 day extended Holter monitor: Dominant rhythm normal sinus (37 - 145 bpm, avg HR 76 bpm). Minimum HR 37bpm at 2:37 AM. Total supraventricular ectopic (SVE) burden <1%. 35 episodes of SVT, fastest episode was 11 beats in duration at a rate of 261 bpm, the longest episode  was 3hr 12mn at an avg HR 152 bpm.  Atrial flutter burden <1%, longest duration 4 minutes 40 seconds, average heart rate 148 bpm.  Isolated ventricular ectopy (VE) burden 19.7%. Longest ventricular trigeminy episode was ~286m. 150 patient triggered events: Underlying rhythm either NSR or SVT with  ventricular bigeminy/trigeminy or SVE and VE.  LABORATORY DATA: CBC Latest Ref Rng & Units 12/22/2017 01/25/2008 01/24/2008  WBC 4.0 - 10.5 K/uL 5.6 5.6 3.9(L)  Hemoglobin 13.0 - 17.0 g/dL 12.0(L) 11.5(L) 11.4(L)  Hematocrit 39 - 52 % 35.5(L) 34.4(L) 33.0(L)  Platelets 150 - 400 K/uL 142(L) 125 RESULT REPEATED AND VERIFIED(L) 86(L)    CMP Latest Ref Rng & Units 07/04/2018 12/22/2017 01/25/2008  Glucose 65 - 99 mg/dL 129(H) 116(H) 140(H)  BUN 6 - 24 mg/dL 14 16 9   Creatinine 0.76 - 1.27 mg/dL 1.09 1.04 0.92  Sodium 134 - 144 mmol/L 141 134(L) 139  Potassium 3.5 - 5.2 mmol/L 4.3 4.2 3.7 REPEATED TO VERIFY DELTA CHECK NOTED  Chloride 96 - 106 mmol/L 100 99(L) 105  CO2 20 - 29 mmol/L 23 29 27   Calcium 8.7 - 10.2 mg/dL 9.9 9.3 8.9  Total Protein 6.0 - 8.5 g/dL 7.7 7.4 -  Total Bilirubin 0.0 - 1.2 mg/dL 0.3 0.4 -  Alkaline Phos 39 - 117 IU/L 76 61 -  AST 0 - 40 IU/L 19 18 -  ALT 0 - 44 IU/L 11 11(L) -    Lipid Panel     Component Value Date/Time   CHOL 147 07/04/2018 1518   TRIG 79 07/04/2018 1518   HDL 39 (L) 07/04/2018 1518   CHOLHDL 3.8 07/04/2018 1518   CHOLHDL 2.9 12/25/2017 0623   VLDL 13 12/25/2017 0623   LDLCALC 92 07/04/2018 1518   LABVLDL 16 07/04/2018 1518    No components found for: NTPROBNP No results for input(s): PROBNP in the last 8760 hours. No results for input(s): TSH in the last 8760 hours.  BMP No results for input(s): NA, K, CL, CO2, GLUCOSE, BUN, CREATININE, CALCIUM, GFRNONAA, GFRAA in the last 8760 hours.  HEMOGLOBIN A1C Lab Results  Component Value Date   HGBA1C 7.8 (A) 07/04/2018   MPG 139.85 12/26/2017    External Labs: Collected: 11/02/2019 Creatinine 1.32 mg/dL. eGFR: >607min per 1.73 m Lipid profile: Total cholesterol 120, triglycerides 87, HDL 36, LDL 67, non-HDL 84 Hemoglobin A1c: 7.6 TSH: 2.5   IMPRESSION:    ICD-10-CM   1. SVT (supraventricular tachycardia) (HCC)  I47.1 Ambulatory referral to Cardiac Electrophysiology    metoprolol  succinate (TOPROL XL) 50 MG 24 hr tablet  2. Premature ventricular complex  I49.3 metoprolol succinate (TOPROL XL) 50 MG 24 hr tablet  3. Typical atrial flutter (HCC)  I48.3 metoprolol succinate (TOPROL XL) 50 MG 24 hr tablet    aspirin EC 81 MG tablet  4. Schizoaffective disorder, bipolar type ([place]CC[place]ake Michigan BeachF25.0   5. Cigarette smoker  F17.210   6. Type 2 diabetes mellitus with hyperglycemia, without long-term current use of insulin (HCC)  E11.65   7. Encounter to discuss test results  Z71.2      RECOMMENDATIONS: Frederick Fletcher a 58 47o. male whose past medical history and cardiac risk factors include: schizoaffective disorder, type 2 diabetes mellitus, active cigarette smoking, former marijuana use, symptomatic supraventricular tachycardia, paroxysmal atrial flutter.  Total ventricular ectopic burden over the course of 7 days due to isolated ectopic beats is approximately 19.7%.  When evaluating on a day-to-day basis the highest ventricular ectopic burden  was noted to be 24.4% on March 13, 2020.  In addition, he has ventricular ectopic beats in both bigeminy and trigeminy pattern.  On the monitor patient had multiple episodes of supraventricular tachycardia many of which were patient triggered and for prolonged period of time.  In addition, he was noted to have a brief episode of atrial flutter which accounted for 4-minute and 40 seconds.  Atrial flutter was also noted when he came in for his stress test.  CHA2DS2-VASc score 1(DM).  Currently he does not want to be on oral anticoagulation and in the long run patient may not be an ideal oral anticoagulation candidate given his underlying psychiatric illness.  For now patient will start aspirin.  Given the findings of elevated ventricular ectopic burden, symptomatic SVT, and findings of atrial flutter we will start the patient on pharmacological therapy for now and recommend EP evaluation for possible EP study and/or ablation.  Cigarette  smoking: Educated importance of complete smoking cessation.  Type 2 diabetes mellitus: Most recent hemoglobin A1c reviewed.  Currently managed by PCP.  Schizoaffective disorder: Managed by primary team.  FINAL MEDICATION LIST END OF ENCOUNTER: Meds ordered this encounter  Medications  . metoprolol succinate (TOPROL XL) 50 MG 24 hr tablet    Sig: Take 1 tablet (50 mg total) by mouth daily.    Dispense:  90 tablet    Refill:  0  . aspirin EC 81 MG tablet    Sig: Take 1 tablet (81 mg total) by mouth daily. Swallow whole.    Dispense:  90 tablet    Refill:  3     Current Outpatient Medications:  .  benztropine (COGENTIN) 0.5 MG tablet, Take 1 tablet by mouth 2 (two) times daily., Disp: , Rfl:  .  divalproex (DEPAKOTE) 500 MG DR tablet, Take 1 tablet (500 mg total) by mouth every morning. (Take 1 tablet (500 mg) by mouth in the mornings & 2 tablets (1,000 mg) at bedtime: For mood stabilization, Disp: 90 tablet, Rfl: 0 .  metFORMIN (GLUCOPHAGE XR) 500 MG 24 hr tablet, Take 2 tablets (1,000 mg total) by mouth daily with breakfast., Disp: 120 tablet, Rfl: 3 .  OLANZapine (ZYPREXA) 20 MG tablet, Take 1 tablet (20 mg total) by mouth at bedtime. For mood control, Disp: 30 tablet, Rfl: 0 .  aspirin EC 81 MG tablet, Take 1 tablet (81 mg total) by mouth daily. Swallow whole., Disp: 90 tablet, Rfl: 3 .  metoprolol succinate (TOPROL XL) 50 MG 24 hr tablet, Take 1 tablet (50 mg total) by mouth daily., Disp: 90 tablet, Rfl: 0  Orders Placed This Encounter  Procedures  . Ambulatory referral to Cardiac Electrophysiology   --Continue cardiac medications as reconciled in final medication list. --Return in about 3 months (around 06/28/2020) for Follow up SVT, paroxysmal AFL. Or sooner if needed. --Continue follow-up with your primary care physician regarding the management of your other chronic comorbid conditions.  Patient's questions and concerns were addressed to his satisfaction. He voices  understanding of the instructions provided during this encounter.   This note was created using a voice recognition software as a result there may be grammatical errors inadvertently enclosed that do not reflect the nature of this encounter. Every attempt is made to correct such errors.  Rex Kras, [place]  Pager: [phone] Office: [phone]

## 2020-03-31 ENCOUNTER — Encounter: Payer: Self-pay | Admitting: Cardiology

## 2020-04-04 ENCOUNTER — Ambulatory Visit: Payer: Medicare Other | Admitting: Cardiology

## 2020-04-05 ENCOUNTER — Other Ambulatory Visit: Payer: Self-pay

## 2020-04-05 ENCOUNTER — Ambulatory Visit (INDEPENDENT_AMBULATORY_CARE_PROVIDER_SITE_OTHER): Payer: Medicare Other | Admitting: Internal Medicine

## 2020-04-05 DIAGNOSIS — I483 Typical atrial flutter: Secondary | ICD-10-CM | POA: Diagnosis not present

## 2020-04-05 DIAGNOSIS — I471 Supraventricular tachycardia: Secondary | ICD-10-CM | POA: Diagnosis not present

## 2020-04-05 NOTE — Patient Instructions (Signed)

## 2020-04-05 NOTE — Progress Notes (Signed)
HPI [name] is referred today by Dr. Terri Skains for evaluation of SVT. He has a h/o palpitations though not particularly symptomatic. He also has a h/o pysychosis and is on medical therapy. He has a caretaker with him today who notes that his activity level has gone down some. He denies weight loss. 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  No Known Allergies   Current Outpatient Medications  Medication Sig Dispense Refill  . aspirin EC 81 MG tablet Take 1 tablet (81 mg total) by mouth daily. Swallow whole. 90 tablet 3  . benztropine (COGENTIN) 0.5 MG tablet Take 1 tablet by mouth 2 (two) times daily.    . divalproex (DEPAKOTE) 500 MG DR tablet Take 1 tablet (500 mg total) by mouth every morning. (Take 1 tablet (500 mg) by mouth in the mornings & 2 tablets (1,000 mg) at bedtime: For mood stabilization 90 tablet 0  . metFORMIN (GLUCOPHAGE XR) 500 MG 24 hr tablet Take 2 tablets (1,000 mg total) by mouth daily with breakfast. 120 tablet 3  . metoprolol succinate (TOPROL XL) 50 MG 24 hr tablet Take 1 tablet (50 mg total) by mouth daily. 90 tablet 0  . OLANZapine (ZYPREXA) 20 MG tablet Take 1 tablet (20 mg total) by mouth at bedtime. For mood control 30 tablet 0   No current facility-administered medications for this visit.     Past Medical History:  Diagnosis Date  . Atrial flutter, paroxysmal ([place])    <76min on holter monitor (02/2020)  . Chronic gastric ulcer   . Diabetes mellitus without complication ([place])   . Paroxysmal SVT (supraventricular tachycardia) ([place])   . PVC (premature ventricular contraction)   . Schizoaffective disorder ([place])     ROS:   All systems reviewed and negative except as noted in the HPI.   No past surgical history on file.   Family History  Problem Relation Age of Onset  . Stroke Mother   . Cancer Father      Social History   Socioeconomic History  . Marital status: Single    Spouse name: Not on file  . Number of  children: 0  . Years of education: Not on file  . Highest education level: Not on file  Occupational History  . Not on file  Tobacco Use  . Smoking status: Former Smoker    Packs/day: 1.00    Types: Cigarettes  . Smokeless tobacco: Never Used  Vaping Use  . Vaping Use: Never used  Substance and Sexual Activity  . Alcohol use: Not Currently  . Drug use: Not Currently    Types: Marijuana  . Sexual activity: Not Currently  Other Topics Concern  . Not on file  Social History Narrative  . Not on file   Social Determinants of Health   Financial Resource Strain:   . Difficulty of Paying Living Expenses: Not on file  Food Insecurity:   . Worried About Charity fundraiser in the Last Year: Not on file  . Ran Out of Food in the Last Year: Not on file  Transportation Needs:   . Lack of Transportation (Medical): Not on file  . Lack of Transportation (Non-Medical): Not on file  Physical Activity:   . Days of Exercise per Week: Not on file  . Minutes of Exercise per Session: Not on file  Stress:   . Feeling of Stress : Not on file  Social Connections:   . Frequency of Communication with  Friends and Family: Not on file  . Frequency of Social Gatherings with Friends and Family: Not on file  . Attends Religious Services: Not on file  . Active Member of Clubs or Organizations: Not on file  . Attends Archivist Meetings: Not on file  . Marital Status: Not on file  Intimate Partner Violence:   . Fear of Current or Ex-Partner: Not on file  . Emotionally Abused: Not on file  . Physically Abused: Not on file  . Sexually Abused: Not on file     BP 102/60   Pulse 61   Ht 6' (1.829 m)   Wt 134 lb (60.8 kg)   SpO2 98%   BMI 18.17 kg/m   Physical Exam:  Well appearing 59 yo man, NAD HEENT: Unremarkable Neck:  6 cm JVD, no thyromegally Lymphatics:  No adenopathy Back:  No CVA tenderness Lungs:  Clear with no wheezes HEART:  Regular rate rhythm, no murmurs, no rubs, no  clicks Abd:  soft, positive bowel sounds, no organomegally, no rebound, no guarding Ext:  2 plus pulses, no edema, no cyanosis, no clubbing Skin:  No rashes no nodules Neuro:  CN II through XII intact, motor grossly intact  EKG - nsr with no pre-excitation, rare PVC   Assess/Plan: 1. SVT - he is not particularly symptomatic. I discussed the treatment options with the patient. For now, and unless he becomes more symptomatic, he would like to continue watchful waiting and his beta blocker. 2. PVC's - he is asymptomatic. Continue current meds. 3. Tobacco abuse - I encouraged him to stop smoking.  Carleene Overlie Frederick Laymon,MD

## 2020-04-11 ENCOUNTER — Ambulatory Visit: Payer: Medicare Other | Admitting: Cardiology

## 2020-04-25 ENCOUNTER — Other Ambulatory Visit: Payer: Self-pay | Admitting: Nephrology

## 2020-04-25 DIAGNOSIS — N1831 Chronic kidney disease, stage 3a: Secondary | ICD-10-CM

## 2020-05-01 ENCOUNTER — Other Ambulatory Visit: Payer: Medicare Other

## 2020-06-22 ENCOUNTER — Other Ambulatory Visit: Payer: Self-pay | Admitting: Cardiology

## 2020-06-22 DIAGNOSIS — I483 Typical atrial flutter: Secondary | ICD-10-CM

## 2020-06-22 DIAGNOSIS — I493 Ventricular premature depolarization: Secondary | ICD-10-CM

## 2020-06-22 DIAGNOSIS — I471 Supraventricular tachycardia: Secondary | ICD-10-CM

## 2020-06-28 ENCOUNTER — Ambulatory Visit: Payer: Medicare Other | Admitting: Cardiology

## 2020-07-04 ENCOUNTER — Other Ambulatory Visit: Payer: Self-pay

## 2020-07-04 ENCOUNTER — Encounter: Payer: Self-pay | Admitting: Cardiology

## 2020-07-04 ENCOUNTER — Ambulatory Visit: Payer: Medicare Other | Admitting: Cardiology

## 2020-07-04 VITALS — BP 131/64 | HR 76 | Resp 16 | Ht 72.0 in | Wt 135.0 lb

## 2020-07-04 DIAGNOSIS — I483 Typical atrial flutter: Secondary | ICD-10-CM

## 2020-07-04 DIAGNOSIS — I4719 Other supraventricular tachycardia: Secondary | ICD-10-CM

## 2020-07-04 DIAGNOSIS — I471 Supraventricular tachycardia, unspecified: Secondary | ICD-10-CM

## 2020-07-04 DIAGNOSIS — F250 Schizoaffective disorder, bipolar type: Secondary | ICD-10-CM

## 2020-07-04 DIAGNOSIS — F17210 Nicotine dependence, cigarettes, uncomplicated: Secondary | ICD-10-CM

## 2020-07-04 DIAGNOSIS — E1165 Type 2 diabetes mellitus with hyperglycemia: Secondary | ICD-10-CM

## 2020-07-04 DIAGNOSIS — I493 Ventricular premature depolarization: Secondary | ICD-10-CM

## 2020-07-04 DIAGNOSIS — Z87891 Personal history of nicotine dependence: Secondary | ICD-10-CM

## 2020-07-04 NOTE — Progress Notes (Signed)
ID:  ROMAIN ERION, DOB [birth year], MRN [id number]  PCP:  Roslyn Smiling, MD  Cardiologist:  Rex Kras, DO, [facility] (established care 03/13/2020)  Date: 07/04/2020 Last Office Visit: 03/29/2020  Chief Complaint  Patient presents with  . SVT (supraventricular tachycardia)  . Follow-up    HPI  Frederick Fletcher is a 59 y.o. male who presents to the office with a chief complaint of " follow-up for SVT." Patient's past medical history and cardiovascular risk factors include: schizoaffective disorder, type 2 diabetes mellitus, active cigarette smoking, former marijuana use, supraventricular tachycardia (AVNRT), paroxysmal atrial flutter (<85mn on holter monitor).  Patient is referred to the office at the request of his primary care for evaluation of high blood pressure and premature ventricular contractions.  Patient is a pleasant gentleman but a poor historian given his underlying schizoaffective disorder.  Given his underlying premature ventricular contractions he was recommended to undergo 7-day extended Holter monitor to evaluate PAC/PVC burden.  The monitor results are noted below for further reference; but in summary he was noted to have normal sinus rhythm with a atrial flutter burden of less than 5 minutes in duration.  He was notified of this finding during prior office visits.  Since the atrial flutter was less than 5 minutes in duration clinically not warranting oral anticoagulation at this time.  At the last office visit, we did discuss management of atrial flutter and after discussing oral anticoagulation patient stated that if they were warranted he would not consider oral anticoagulants due to risk of bleeding.   Since last office visit patient [facility] suggestive of AVNRT and PVC burden.  However, since the patient is asymptomatic recommendations were to treat him medically for now.  Since last office visit patient states that he  is doing well from a cardiovascular standpoint.  He was started on beta-blocker therapy at last office visit he has tolerated well without any side effects or intolerances.  And his ventricular rate has actually improved.  FUNCTIONAL STATUS: He walks about 1 hr per day.   ALLERGIES: No Known Allergies  MEDICATION LIST PRIOR TO VISIT: Current Meds  Medication Sig  . aspirin EC 81 MG tablet Take 1 tablet (81 mg total) by mouth daily. Swallow whole.  . benztropine (COGENTIN) 0.5 MG tablet Take 1 tablet by mouth 2 (two) times daily.  . divalproex (DEPAKOTE) 500 MG DR tablet Take 1 tablet (500 mg total) by mouth every morning. (Take 1 tablet (500 mg) by mouth in the mornings & 2 tablets (1,000 mg) at bedtime: For mood stabilization  . metFORMIN (GLUCOPHAGE XR) 500 MG 24 hr tablet Take 2 tablets (1,000 mg total) by mouth daily with breakfast.  . metoprolol succinate (TOPROL-XL) 50 MG 24 hr tablet TAKE 1 TABLET BY MOUTH EVERY DAY  . OLANZapine (ZYPREXA) 20 MG tablet Take 1 tablet (20 mg total) by mouth at bedtime. For mood control     PAST MEDICAL HISTORY: Past Medical History:  Diagnosis Date  . Atrial flutter, paroxysmal ([place]    <553m on holter monitor (02/2020)  . Chronic gastric ulcer   . Diabetes mellitus without complication ([facility]  . Paroxysmal SVT (supraventricular tachycardia) ([place]  . PVC (premature ventricular contraction)   . Schizoaffective disorder ([place]    PAST SURGICAL HISTORY: History reviewed. No pertinent surgical history.  FAMILY HISTORY: The patient family history includes Cancer in his father; Stroke in his mother.  SOCIAL HISTORY:  The patient  reports that he has quit smoking. His smoking use included cigarettes. He smoked 1.00 pack per day. He has never used smokeless tobacco. He reports previous alcohol use. He reports previous drug use. Drug: Marijuana.  REVIEW OF SYSTEMS: Review of Systems  Constitutional: Negative for chills and fever.  HENT:  Negative for hoarse voice and nosebleeds.   Eyes: Negative for discharge, double vision and pain.  Cardiovascular: Negative for chest pain, claudication, dyspnea on exertion, leg swelling, near-syncope, orthopnea, palpitations, paroxysmal nocturnal dyspnea and syncope.  Respiratory: Negative for hemoptysis and shortness of breath.   Musculoskeletal: Negative for muscle cramps and myalgias.  Gastrointestinal: Negative for abdominal pain, constipation, diarrhea, hematemesis, hematochezia, melena, nausea and vomiting.  Neurological: Negative for dizziness and light-headedness.    PHYSICAL EXAM: Vitals with BMI 07/04/2020 04/05/2020 03/29/2020  Height 6' 0"  6' 0"  6' 0"   Weight 135 lbs 134 lbs 137 lbs  BMI 18.31 16.10 96.04  Systolic 540 981 191  Diastolic 64 60 47  Pulse 76 61 58  Some encounter information is confidential and restricted. Go to Review Flowsheets activity to see all data.    CONSTITUTIONAL: Pleasant age-appropriate gentleman.  Hemodynamically stable, no acute distress.   SKIN: Skin is warm and dry. No rash noted. No cyanosis. No pallor. No jaundice HEAD: Normocephalic and atraumatic.  EYES: No scleral icterus MOUTH/THROAT: Moist oral membranes.  Poor dental hygiene NECK: No JVD present. No thyromegaly noted. No carotid bruits  LYMPHATIC: No visible cervical adenopathy.  CHEST Normal respiratory effort. No intercostal retractions  LUNGS: Clear to auscultation bilaterally.  no stridor. No wheezes. No rales.  CARDIOVASCULAR: Regular, Y7-W2, soft holosystolic murmur heard at the apex, no gallops or rubs. ABDOMINAL: No apparent ascites.  EXTREMITIES: No peripheral edema  HEMATOLOGIC: No significant bruising NEUROLOGIC: Oriented to person, place, and time. Nonfocal. Normal muscle tone.  PSYCHIATRIC: Pleasant gentleman, at times laughing hysterically, degree of pressured speech, noncombative and cooperative.    CARDIAC DATABASE: EKG: 03/13/2020: Normal sinus rhythm, 98 bpm,  normal axis, combined atrial enlargement, IRBBB, TWI in inferior lateral leads suggestive of possible inferolateral ischemia, PACs.  No prior EKGs for comparison.  Echocardiogram: 03/22/2020:  Left ventricle cavity is normal in size and wall thickness. Normal global wall motion. Frequent ectopy seen. Normal LV systolic function with EF 61%. Indeterminate diastolic filling pattern.  Left atrial cavity is severely dilated. Etiology is unclear.  Moderate (Grade II) mitral regurgitation.  Inadequate TR jet to estimate pulmonary artery systolic pressure. Estimated RA pressure 8 mmHg.  Stress Testing: Lexiscan Tetrofosmin Stress Test  03/25/2020: Stress changed from exercise tetrofosmin to Lexiscan in view of AFL with RVR.  Nondiagnostic ECG stress. Resting EKG  demonstrated atrial flutter with rapid ventricular response with 2:1 conduction. Non-specific ST-T abnormality. Peak EKG demonstrated atrial flutter with rapid ventricular response. No significant ST-T change from baseline abnormality. Myocardial perfusion is normal. Overall LV systolic function is abnormal without regional wall motion abnormalities. Stress LV EF: 38%. LVEF calculation may be affected by difficulty in gating with AFL with RVR. Intermediate risk study due to reduced LVEF. Consider non-ischemic cardiomyopathy.  No previous exam available for comparison.  Heart Catheterization: None  7 day extended Holter monitor: Dominant rhythm normal sinus (37 - 145 bpm, avg HR 76 bpm). Minimum HR 37bpm at 2:37 AM. Total supraventricular ectopic (SVE) burden <1%. 35 episodes of SVT, fastest episode was 11 beats in duration at a rate of 261 bpm, the longest episode was 3hr 28mn at an avg HR  152 bpm.  Atrial flutter burden <1%, longest duration 4 minutes 40 seconds, average heart rate 148 bpm.  Isolated ventricular ectopy (VE) burden 19.7%. Longest ventricular trigeminy episode was ~71mn. 150 patient triggered events: Underlying rhythm  either NSR or SVT with ventricular bigeminy/trigeminy or SVE and VE.  LABORATORY DATA: CBC Latest Ref Rng & Units 12/22/2017 01/25/2008 01/24/2008  WBC 4.0 - 10.5 K/uL 5.6 5.6 3.9(L)  Hemoglobin 13.0 - 17.0 g/dL 12.0(L) 11.5(L) 11.4(L)  Hematocrit 39.0 - 52.0 % 35.5(L) 34.4(L) 33.0(L)  Platelets 150 - 400 K/uL 142(L) 125 RESULT REPEATED AND VERIFIED(L) 86(L)    CMP Latest Ref Rng & Units 07/04/2018 12/22/2017 01/25/2008  Glucose 65 - 99 mg/dL 129(H) 116(H) 140(H)  BUN 6 - 24 mg/dL 14 16 9   Creatinine 0.76 - 1.27 mg/dL 1.09 1.04 0.92  Sodium 134 - 144 mmol/L 141 134(L) 139  Potassium 3.5 - 5.2 mmol/L 4.3 4.2 3.7 REPEATED TO VERIFY DELTA CHECK NOTED  Chloride 96 - 106 mmol/L 100 99(L) 105  CO2 20 - 29 mmol/L 23 29 27   Calcium 8.7 - 10.2 mg/dL 9.9 9.3 8.9  Total Protein 6.0 - 8.5 g/dL 7.7 7.4 -  Total Bilirubin 0.0 - 1.2 mg/dL 0.3 0.4 -  Alkaline Phos 39 - 117 IU/L 76 61 -  AST 0 - 40 IU/L 19 18 -  ALT 0 - 44 IU/L 11 11(L) -    Lipid Panel     Component Value Date/Time   CHOL 147 07/04/2018 1518   TRIG 79 07/04/2018 1518   HDL 39 (L) 07/04/2018 1518   CHOLHDL 3.8 07/04/2018 1518   CHOLHDL 2.9 12/25/2017 0623   VLDL 13 12/25/2017 0623   LDLCALC 92 07/04/2018 1518   LABVLDL 16 07/04/2018 1518    No components found for: NTPROBNP No results for input(s): PROBNP in the last 8760 hours. No results for input(s): TSH in the last 8760 hours.  BMP No results for input(s): NA, K, CL, CO2, GLUCOSE, BUN, CREATININE, CALCIUM, GFRNONAA, GFRAA in the last 8760 hours.  HEMOGLOBIN A1C Lab Results  Component Value Date   HGBA1C 7.8 (A) 07/04/2018   MPG 139.85 12/26/2017    External Labs: Collected: 11/02/2019 Creatinine 1.32 mg/dL. eGFR: >638mmin per 1.73 m Lipid profile: Total cholesterol 120, triglycerides 87, HDL 36, LDL 67, non-HDL 84 Hemoglobin A1c: 7.6 TSH: 2.5   IMPRESSION:    ICD-10-CM   1. SVT (supraventricular tachycardia) (HCC)  I47.1   2. AVNRT (AV nodal re-entry  tachycardia) (HCC)  I47.1   3. Premature ventricular complex  I49.3   4. Typical atrial flutter (HCC)  I48.3   5. Schizoaffective disorder, bipolar type ([place] F25.0   6. Cigarette smoker  F17.210   7. Type 2 diabetes mellitus with hyperglycemia, without long-term current use of insulin (HCC)  E11.65      RECOMMENDATIONS: Frederick Fletcher a [age].o. male whose past medical history and cardiac risk factors include: schizoaffective disorder, type 2 diabetes mellitus, active cigarette smoking, former marijuana use, symptomatic supraventricular tachycardia, paroxysmal atrial flutter.  Supraventricular tachycardia:  Referred to EP for further evaluation and management.  Per their recommendations patient is currently asymptomatic and would recommend medical therapy.  Continue beta-blockers.  Continue to monitor.  Premature ventricular contractions:  As per the recent monitor patient's average PVC burden is approximately 19.7%.  He was referred to cardiac electrophysiology for further recommendations.  Per there evaluation since he is asymptomatic would recommend medical therapy for now.  Continue metoprolol succinate  50 mg p.o. daily  Paroxysmal atrial flutter:  This was reported on his recent monitor; however, the overall burden was less than 5 minutes.  CHA2DS2-VASc SCORE is 1.   When discussing disease management explained the role of AV nodal blocking agents, antiarrhythmic medications, and oral anticoagulation.  After discussing oral anticoagulation patient stated that if they were warranted he would not consider oral anticoagulants due to risk of bleeding.   Currently aspirin 81 mg p.o. daily  Cigarette smoking:   Tobacco cessation counseling:  Currently smoking 1 packs/day    Patient was informed of the dangers of tobacco abuse including stroke, cancer, and MI, as well as benefits of tobacco cessation.  Patient is not willing to quit at this time.  Approximately 7  mins were spent counseling patient cessation techniques. We discussed various methods to help quit smoking, including deciding on a date to quit, joining a support group, pharmacological agents- nicotine gum/patch/lozenges, chantix.   I will reassess his progress at the next follow-up visit  Type 2 diabetes mellitus: Most recent hemoglobin A1c reviewed.  Currently managed by PCP.  Schizoaffective disorder: Managed by primary team.  FINAL MEDICATION LIST END OF ENCOUNTER: No orders of the defined types were placed in this encounter.    Current Outpatient Medications:  .  aspirin EC 81 MG tablet, Take 1 tablet (81 mg total) by mouth daily. Swallow whole., Disp: 90 tablet, Rfl: 3 .  benztropine (COGENTIN) 0.5 MG tablet, Take 1 tablet by mouth 2 (two) times daily., Disp: , Rfl:  .  divalproex (DEPAKOTE) 500 MG DR tablet, Take 1 tablet (500 mg total) by mouth every morning. (Take 1 tablet (500 mg) by mouth in the mornings & 2 tablets (1,000 mg) at bedtime: For mood stabilization, Disp: 90 tablet, Rfl: 0 .  metFORMIN (GLUCOPHAGE XR) 500 MG 24 hr tablet, Take 2 tablets (1,000 mg total) by mouth daily with breakfast., Disp: 120 tablet, Rfl: 3 .  metoprolol succinate (TOPROL-XL) 50 MG 24 hr tablet, TAKE 1 TABLET BY MOUTH EVERY DAY, Disp: 90 tablet, Rfl: 0 .  OLANZapine (ZYPREXA) 20 MG tablet, Take 1 tablet (20 mg total) by mouth at bedtime. For mood control, Disp: 30 tablet, Rfl: 0  No orders of the defined types were placed in this encounter.  --Continue cardiac medications as reconciled in final medication list. --Return in about 1 year (around 07/04/2021) for Follow up SVT, PVCs, AVNRT. Or sooner if needed. --Continue follow-up with your primary care physician regarding the management of your other chronic comorbid conditions.  Patient's questions and concerns were addressed to his satisfaction. He voices understanding of the instructions provided during this encounter.   This note was created  using a voice recognition software as a result there may be grammatical errors inadvertently enclosed that do not reflect the nature of this encounter. Every attempt is made to correct such errors.  Rex Kras, [place], [facility]  Pager: [phone] Office: [phone]

## 2020-10-15 ENCOUNTER — Ambulatory Visit: Payer: Medicaid Other | Admitting: Internal Medicine

## 2021-01-17 ENCOUNTER — Other Ambulatory Visit: Payer: Self-pay | Admitting: Nephrology

## 2021-01-17 DIAGNOSIS — D631 Anemia in chronic kidney disease: Secondary | ICD-10-CM

## 2021-01-17 DIAGNOSIS — N1831 Chronic kidney disease, stage 3a: Secondary | ICD-10-CM

## 2021-01-23 ENCOUNTER — Other Ambulatory Visit: Payer: Medicaid Other

## 2021-01-23 ENCOUNTER — Ambulatory Visit
Admission: RE | Admit: 2021-01-23 | Discharge: 2021-01-23 | Disposition: A | Discharge status: Home / Self Care | Payer: Medicaid Other | Source: Ambulatory Visit | Attending: Nephrology | Admitting: Nephrology

## 2021-01-23 DIAGNOSIS — N1831 Chronic kidney disease, stage 3a: Secondary | ICD-10-CM

## 2021-01-23 DIAGNOSIS — N189 Chronic kidney disease, unspecified: Secondary | ICD-10-CM

## 2021-02-06 NOTE — Progress Notes (Signed)
02/04/2021 At the request of Dr. Tolia, deleting unwanted pending communication displaying in letter queue folder via inbasket.  Carilyn Woolston, RN CHL [employer] 

## 2021-04-25 ENCOUNTER — Other Ambulatory Visit: Payer: Self-pay | Admitting: Nephrology

## 2021-04-25 DIAGNOSIS — N1831 Chronic kidney disease, stage 3a: Secondary | ICD-10-CM

## 2021-05-28 DIAGNOSIS — E119 Type 2 diabetes mellitus without complications: Secondary | ICD-10-CM | POA: Insufficient documentation

## 2021-05-28 NOTE — Progress Notes (Signed)
Subjective:    Frederick Fletcher - 60 y.o. male MRN [id number]  Date of birth: 1960-08-28  HPI  Frederick Fletcher is to establish care.   Current issues and/or concerns: DIABETES TYPE 2: Med Adherence:  [x]  Yes    []  No Medication side effects:  []  Yes    [x]  No Exercise: [x]  Yes    []  No   ROS per HPI    Health Maintenance:  Health Maintenance Due  Topic Date Due   COVID-19 Vaccine (1) Never done   Pneumococcal Vaccine 56-33 Years old (1 - PCV) Never done   FOOT EXAM  Never done   OPHTHALMOLOGY EXAM  Never done   URINE MICROALBUMIN  Never done   HIV Screening  Never done   Hepatitis C Screening  Never done   TETANUS/TDAP  Never done   COLONOSCOPY (Pts 45-68yrs Insurance coverage will need to be confirmed)  Never done   Zoster Vaccines- Shingrix (1 of 2) Never done    Past Medical History: Patient Active Problem List   Diagnosis Date Noted   Diabetes mellitus, type 2 ([place]) 05/28/2021   SVT (supraventricular tachycardia) ([place]) 04/05/2020   Typical atrial flutter ([place]) 04/05/2020   Schizoaffective disorder, bipolar type ([place]) 12/24/2017    Social History   reports that he has quit smoking. His smoking use included cigarettes. He smoked an average of 1 pack per day. He has never used smokeless tobacco. He reports that he does not currently use alcohol. He reports that he does not currently use drugs after having used the following drugs: Marijuana.   Family History  family history includes Cancer in his father; Stroke in his mother.   Medications: reviewed and updated   Objective:   Physical Exam BP 114/71 (BP Location: Right Arm, Patient Position: Sitting, Cuff Size: Normal)   Pulse (!) 102   Resp 20   Ht 6\' 1"  (1.854 m)   Wt 131 lb (59.4 kg)   SpO2 98%   BMI 17.28 kg/m   Physical Exam HENT:     Head: Normocephalic and atraumatic.  Eyes:     Extraocular Movements: Extraocular movements intact.     Conjunctiva/sclera: Conjunctivae normal.      Pupils: Pupils are equal, round, and reactive to light.  Cardiovascular:     Rate and Rhythm: Tachycardia present.     Pulses: Normal pulses.     Heart sounds: Normal heart sounds.  Pulmonary:     Effort: Pulmonary effort is normal.     Breath sounds: Normal breath sounds.  Abdominal:     General: Bowel sounds are normal.     Palpations: Abdomen is soft.  Musculoskeletal:     Cervical back: Normal range of motion and neck supple.  Neurological:     General: No focal deficit present.     Mental Status: He is alert and oriented to person, place, and time.  Psychiatric:        Mood and Affect: Mood normal.        Behavior: Behavior normal.      Results for orders placed or performed in visit on 05/29/21  POCT glycosylated hemoglobin (Hb A1C)  Result Value Ref Range   Hemoglobin A1C     HbA1c POC (<> result, manual entry)     HbA1c, POC (prediabetic range)     HbA1c, POC (controlled diabetic range) 6.5 0.0 - 7.0 %    Assessment & Plan:  1. Encounter to establish care: - Patient presents  today to establish care.  - Return for annual physical examination, labs, and health maintenance. Arrive fasting meaning having no food for at least 8 hours prior to appointment. You may have only water or black coffee. Please take scheduled medications as normal.  2. Type 2 diabetes mellitus without complication, without long-term current use of insulin ([place]): - Hemoglobin A1c today at goal at 6.5%, goal < 7%. Next due February 2023. - Continue Metformin as prescribed.  - Discussed the importance of healthy eating habits, low-carbohydrate diet, low-sugar diet, regular aerobic exercise (at least 150 minutes a week as tolerated) and medication compliance to achieve or maintain control of diabetes. - To achieve an A1C goal of less than or equal to 7.0 percent, a fasting blood sugar of 80 to 130 mg/dL and a postprandial glucose (90 to 120 minutes after a meal) less than 180 mg/dL. In the event of  sugars less than 60 mg/dl or greater than 400 mg/dl please notify the clinic ASAP. It is recommended that you undergo annual eye exams and annual foot exams. - Follow-up with primary provider in 3 months or sooner if needed.  - POCT glycosylated hemoglobin (Hb A1C) - metFORMIN (GLUCOPHAGE XR) 500 MG 24 hr tablet; Take 2 tablets (1,000 mg total) by mouth daily with breakfast.  Dispense: 180 tablet; Refill: 0  3. Stage 3a chronic kidney disease ([place]): - Referral to Nephrology for further evaluation and management. - Ambulatory referral to Nephrology  4. Schizoaffective disorder, bipolar type [facility]): - Referral to Psychiatry for further evaluation and management.  - Ambulatory referral to Psychiatry  5. SVT (supraventricular tachycardia) ([place]): 6. Tachycardia: 7. PVC (premature ventricular contraction): 8. Typical atrial flutter [facility]): - Keep all scheduled appointments with Cardiology.     Patient was given clear instructions to go to Emergency Department or return to medical center if symptoms don't improve, worsen, or new problems develop.The patient verbalized understanding.  I discussed the assessment and treatment plan with the patient. The patient was provided an opportunity to ask questions and all were answered. The patient agreed with the plan and demonstrated an understanding of the instructions.   The patient was advised to call back or seek an in-person evaluation if the symptoms worsen or if the condition fails to improve as anticipated.    Durene Fruits, NP 05/29/2021, 1:01 PM Primary Care at [facility]

## 2021-05-29 ENCOUNTER — Encounter: Payer: Self-pay | Admitting: Family

## 2021-05-29 ENCOUNTER — Other Ambulatory Visit: Payer: Self-pay

## 2021-05-29 ENCOUNTER — Ambulatory Visit: Payer: Medicaid Other | Admitting: Family

## 2021-05-29 VITALS — BP 114/71 | HR 102 | Resp 20 | Ht 73.0 in | Wt 131.0 lb

## 2021-05-29 DIAGNOSIS — I471 Supraventricular tachycardia: Secondary | ICD-10-CM | POA: Diagnosis not present

## 2021-05-29 DIAGNOSIS — Z23 Encounter for immunization: Secondary | ICD-10-CM

## 2021-05-29 DIAGNOSIS — I483 Typical atrial flutter: Secondary | ICD-10-CM

## 2021-05-29 DIAGNOSIS — F250 Schizoaffective disorder, bipolar type: Secondary | ICD-10-CM | POA: Diagnosis not present

## 2021-05-29 DIAGNOSIS — Z7689 Persons encountering health services in other specified circumstances: Secondary | ICD-10-CM

## 2021-05-29 DIAGNOSIS — E119 Type 2 diabetes mellitus without complications: Secondary | ICD-10-CM

## 2021-05-29 DIAGNOSIS — I493 Ventricular premature depolarization: Secondary | ICD-10-CM

## 2021-05-29 DIAGNOSIS — N1831 Chronic kidney disease, stage 3a: Secondary | ICD-10-CM | POA: Diagnosis not present

## 2021-05-29 DIAGNOSIS — R000 Tachycardia, unspecified: Secondary | ICD-10-CM

## 2021-05-29 LAB — POCT GLYCOSYLATED HEMOGLOBIN (HGB A1C): HbA1c, POC (controlled diabetic range): 6.5 % (ref 0.0–7.0)

## 2021-05-29 MED ORDER — METFORMIN HCL ER 500 MG PO TB24: 1000.0000 mg | ORAL_TABLET | Freq: Every day | ORAL | 0 refills | Status: AC

## 2021-07-02 ENCOUNTER — Ambulatory Visit: Payer: Medicaid Other | Admitting: Cardiology

## 2021-07-02 ENCOUNTER — Encounter: Payer: Self-pay | Admitting: Cardiology

## 2021-07-02 ENCOUNTER — Other Ambulatory Visit: Payer: Self-pay

## 2021-07-02 VITALS — BP 122/67 | HR 65 | Resp 16 | Ht 73.0 in | Wt 135.2 lb

## 2021-07-02 DIAGNOSIS — F250 Schizoaffective disorder, bipolar type: Secondary | ICD-10-CM

## 2021-07-02 DIAGNOSIS — E1165 Type 2 diabetes mellitus with hyperglycemia: Secondary | ICD-10-CM

## 2021-07-02 DIAGNOSIS — F17210 Nicotine dependence, cigarettes, uncomplicated: Secondary | ICD-10-CM

## 2021-07-02 DIAGNOSIS — I471 Supraventricular tachycardia: Secondary | ICD-10-CM

## 2021-07-02 DIAGNOSIS — I483 Typical atrial flutter: Secondary | ICD-10-CM

## 2021-07-02 DIAGNOSIS — I493 Ventricular premature depolarization: Secondary | ICD-10-CM

## 2021-07-02 NOTE — Progress Notes (Signed)
ID:  Frederick Fletcher, DOB August 19, 1960, MRN [id number]  PCP:  Camillia Herter, NP  Cardiologist:  Rex Kras, DO, [facility] (established care 03/13/2020)  Date: 07/02/21 Last Office Visit: 07/04/2020   Chief Complaint  Patient presents with   supraventricular tachycardia   Follow-up    HPI  Frederick Fletcher is a 60 y.o. male who presents to the office with a chief complaint of " 1 year follow-up for SVT." Patient's past medical history and cardiovascular risk factors include: schizoaffective disorder, type 2 diabetes mellitus, active cigarette smoking, former marijuana use, supraventricular tachycardia (AVNRT), paroxysmal atrial flutter (<8mn on holter monitor).  Patient is referred to the office at the request of his primary care for evaluation of high blood pressure and premature ventricular contractions.  Since establishing care he underwent an extended Holter monitor which noted a PVC burden of 19.7% and less than 5-minute duration of atrial flutter.  In the past we discussed oral anticoagulation and the shared decision was to hold off initiation of oral anticoagulation for thromboembolic prophylaxis due to risk of bleeding and his underlying psych affective disorder.  Given his PVC burden he was referred to Dr. Who recommended medical management as he was asymptomatic.  Patient was started on metoprolol and has done well.  He now presents for 1 year follow-up visit.  Patient is asymptomatic from a cardiovascular standpoint.  No hospitalizations or urgent care visits for cardiovascular symptoms.  He denies any chest pain, shortness of breath, lower extremity swelling, orthopnea, paroxysmal nocturnal dyspnea.  He still does not want to be on oral anticoagulation given his other chronic comorbid conditions.  He continues to smoke 1 pack/day.   ALLERGIES: No Known Allergies  MEDICATION LIST PRIOR TO VISIT: Current Meds  Medication Sig   aspirin EC 81 MG tablet Take 1 tablet (81  mg total) by mouth daily. Swallow whole.   benztropine (COGENTIN) 0.5 MG tablet Take 1 tablet by mouth 2 (two) times daily.   divalproex (DEPAKOTE) 500 MG DR tablet Take 1 tablet (500 mg total) by mouth every morning. (Take 1 tablet (500 mg) by mouth in the mornings & 2 tablets (1,000 mg) at bedtime: For mood stabilization   metFORMIN (GLUCOPHAGE XR) 500 MG 24 hr tablet Take 2 tablets (1,000 mg total) by mouth daily with breakfast.   metoprolol succinate (TOPROL-XL) 50 MG 24 hr tablet TAKE 1 TABLET BY MOUTH EVERY DAY   OLANZapine (ZYPREXA) 20 MG tablet Take 1 tablet (20 mg total) by mouth at bedtime. For mood control     PAST MEDICAL HISTORY: Past Medical History:  Diagnosis Date   Atrial flutter, paroxysmal ([facility]    <575m on holter monitor (02/2020)   Chronic gastric ulcer    Diabetes mellitus without complication (HCC)    Paroxysmal SVT (supraventricular tachycardia) (HCC)    PVC (premature ventricular contraction)    Schizoaffective disorder ([place]    PAST SURGICAL HISTORY: History reviewed. No pertinent surgical history.  FAMILY HISTORY: The patient family history includes Cancer in his father; Stroke in his mother.  SOCIAL HISTORY:  The patient  reports that he has quit smoking. His smoking use included cigarettes. He smoked an average of 1 pack per day. He has never used smokeless tobacco. He reports that he does not currently use alcohol. He reports that he does not currently use drugs after having used the following drugs: Marijuana.  REVIEW OF SYSTEMS: Review of Systems  Constitutional: Negative for chills and fever.  HENT:  Negative for hoarse voice and nosebleeds.   Eyes:  Negative for discharge, double vision and pain.  Cardiovascular:  Negative for chest pain, claudication, dyspnea on exertion, leg swelling, near-syncope, orthopnea, palpitations, paroxysmal nocturnal dyspnea and syncope.  Respiratory:  Negative for hemoptysis and shortness of breath.   Musculoskeletal:   Negative for muscle cramps and myalgias.  Gastrointestinal:  Negative for abdominal pain, constipation, diarrhea, hematemesis, hematochezia, melena, nausea and vomiting.  Neurological:  Negative for dizziness and light-headedness.   PHYSICAL EXAM: Vitals with BMI 07/02/2021 05/29/2021 07/04/2020  Height _0  _1  _2   Weight 135 lbs 3 oz 131 lbs 135 lbs  BMI 17.84 55.97 41.63  Systolic 845 364 680  Diastolic 67 71 64  Pulse 65 102 76  Some encounter information is confidential and restricted. Go to Review Flowsheets activity to see all data.    CONSTITUTIONAL: Pleasant age-appropriate gentleman.  Hemodynamically stable, no acute distress.   SKIN: Skin is warm and dry. No rash noted. No cyanosis. No pallor. No jaundice HEAD: Normocephalic and atraumatic.  EYES: No scleral icterus MOUTH/THROAT: Moist oral membranes.  Poor dental hygiene NECK: No JVD present. No thyromegaly noted. No carotid bruits  LYMPHATIC: No visible cervical adenopathy.  CHEST Normal respiratory effort. No intercostal retractions  LUNGS: Clear to auscultation bilaterally.  no stridor. No wheezes. No rales.  CARDIOVASCULAR: Regular, H2-Z2, soft holosystolic murmur heard at the apex, no gallops or rubs. ABDOMINAL: Nonobese, soft, nontender, nondistended, positive bowel sounds in all 4 quadrants, no apparent ascites.  EXTREMITIES: No peripheral edema  HEMATOLOGIC: No significant bruising NEUROLOGIC: Oriented to person, place, and time. Nonfocal. Normal muscle tone.  PSYCHIATRIC: Pleasant gentleman, at times laughing hysterically, degree of pressured speech, noncombative and cooperative.    CARDIAC DATABASE: EKG: 07/02/2021: Sinus bradycardia, 56 bpm IRBBB, LAE, occasional PACs.   Echocardiogram: 03/22/2020:  Left ventricle cavity is normal in size and wall thickness. Normal global wall motion. Frequent ectopy seen. Normal LV systolic function with EF 61%. Indeterminate diastolic filling pattern.  Left atrial  cavity is severely dilated. Etiology is unclear.  Moderate (Grade II) mitral regurgitation.  Inadequate TR jet to estimate pulmonary artery systolic pressure. Estimated RA pressure 8 mmHg.  Stress Testing: Lexiscan Tetrofosmin Stress Test  03/25/2020: Stress changed from exercise tetrofosmin to Lexiscan in view of AFL with RVR.  Nondiagnostic ECG stress. Resting EKG  demonstrated atrial flutter with rapid ventricular response with 2:1 conduction. Non-specific ST-T abnormality. Peak EKG demonstrated atrial flutter with rapid ventricular response. No significant ST-T change from baseline abnormality. Myocardial perfusion is normal. Overall LV systolic function is abnormal without regional wall motion abnormalities. Stress LV EF: 38%. LVEF calculation may be affected by difficulty in gating with AFL with RVR. Intermediate risk study due to reduced LVEF. Consider non-ischemic cardiomyopathy.  No previous exam available for comparison.  Heart Catheterization: None  7 day extended Holter monitor: Dominant rhythm normal sinus (37 - 145 bpm, avg HR 76 bpm). Minimum HR 37bpm at 2:37 AM. Total supraventricular ectopic (SVE) burden <1%. 35 episodes of SVT, fastest episode was 11 beats in duration at a rate of 261 bpm, the longest episode was 3hr 19mn at an avg HR 152 bpm.  Atrial flutter burden <1%, longest duration 4 minutes 40 seconds, average heart rate 148 bpm.  Isolated ventricular ectopy (VE) burden 19.7%. Longest ventricular trigeminy episode was ~23m. 150 patient triggered events: Underlying rhythm either NSR or SVT with ventricular bigeminy/trigeminy or SVE and VE.  LABORATORY DATA: CBC Latest Ref Rng &  Units 12/22/2017 01/25/2008 01/24/2008  WBC 4.0 - 10.5 K/uL 5.6 5.6 3.9(L)  Hemoglobin 13.0 - 17.0 g/dL 12.0(L) 11.5(L) 11.4(L)  Hematocrit 39.0 - 52.0 % 35.5(L) 34.4(L) 33.0(L)  Platelets 150 - 400 K/uL 142(L) 125 RESULT REPEATED AND VERIFIED(L) 86(L)    CMP Latest Ref Rng & Units  07/04/2018 12/22/2017 01/25/2008  Glucose 65 - 99 mg/dL 129(H) 116(H) 140(H)  BUN 6 - 24 mg/dL _0 Creatinine 0.76 - 1.27 mg/dL 1.09 1.04 0.92  Sodium 134 - 144 mmol/L 141 134(L) 139  Potassium 3.5 - 5.2 mmol/L 4.3 4.2 3.7 REPEATED TO VERIFY DELTA CHECK NOTED  Chloride 96 - 106 mmol/L 100 99(L) 105  CO2 20 - 29 mmol/L _1 Calcium 8.7 - 10.2 mg/dL 9.9 9.3 8.9  Total Protein 6.0 - 8.5 g/dL 7.7 7.4 -  Total Bilirubin 0.0 - 1.2 mg/dL 0.3 0.4 -  Alkaline Phos 39 - 117 IU/L 76 61 -  AST 0 - 40 IU/L 19 18 -  ALT 0 - 44 IU/L 11 11(L) -    Lipid Panel     Component Value Date/Time   CHOL 147 07/04/2018 1518   TRIG 79 07/04/2018 1518   HDL 39 (L) 07/04/2018 1518   CHOLHDL 3.8 07/04/2018 1518   CHOLHDL 2.9 12/25/2017 0623   VLDL 13 12/25/2017 0623   LDLCALC 92 07/04/2018 1518   LABVLDL 16 07/04/2018 1518    No components found for: NTPROBNP No results for input(s): PROBNP in the last 8760 hours. No results for input(s): TSH in the last 8760 hours.  BMP No results for input(s): NA, K, CL, CO2, GLUCOSE, BUN, CREATININE, CALCIUM, GFRNONAA, GFRAA in the last 8760 hours.  HEMOGLOBIN A1C Lab Results  Component Value Date   HGBA1C 6.5 05/29/2021   MPG 139.85 12/26/2017    External Labs: Collected: 11/02/2019 Creatinine 1.32 mg/dL. eGFR: >62m/min per 1.73 m Lipid profile: Total cholesterol 120, triglycerides 87, HDL 36, LDL 67, non-HDL 84 Hemoglobin A1c: 7.6 TSH: 2.5   IMPRESSION:    ICD-10-CM   1. SVT (supraventricular tachycardia) (HCC)  I47.1 EKG 12-Lead    2. AVNRT (AV nodal re-entry tachycardia) ([place]  I47.1     3. Premature ventricular complex  I49.3     4. Typical atrial flutter (HCC)  I48.3     5. Schizoaffective disorder, bipolar type ([facility]  F25.0     6. Cigarette smoker  F17.210     7. Type 2 diabetes mellitus with hyperglycemia, without long-term current use of insulin (HCC)  E11.65        RECOMMENDATIONS: Frederick REDFIELDis a 60y.o. male  whose past medical history and cardiac risk factors include: schizoaffective disorder, type 2 diabetes mellitus, active cigarette smoking, former marijuana use, symptomatic supraventricular tachycardia, paroxysmal atrial flutter.  Supraventricular tachycardia: Findings suggestive of AVNRT. Has done well with metoprolol 50 mg p.o. daily. Evaluated by EP in the past who recommended medical management as he is currently asymptomatic given his PSVT. Monitor for now  Premature ventricular contractions: Asymptomatic. EKG today shows sinus bradycardia without PVCs. PVC burden is approximately 19.7%. Continue metoprolol succinate 50 mg p.o. daily  Paroxysmal atrial flutter: Incidentally found on prior monitor, burden less than 1%, duration less than 5 minutes. The findings were conveyed to the patient during prior office visits.  Due to low CHA2DS2-VASc score and given his chronic comorbid conditions patient chooses not to be on oral anticoagulation for now, which is acceptable. CHA2DS2-VASc SCORE is 1.  Continue aspirin 81 mg p.o. daily  Cigarette smoking:  Tobacco cessation counseling: Currently smoking 1 packs/day   Patient was informed of the dangers of tobacco abuse including stroke, cancer, and MI, as well as benefits of tobacco cessation. Patient is not willing to quit at this time. 5 mins were spent counseling patient cessation techniques. We discussed various methods to help quit smoking, including deciding on a date to quit, joining a support group, pharmacological agents- nicotine gum/patch/lozenges.  I will reassess his progress at the next follow-up visit  Type 2 diabetes mellitus:  Currently managed by PCP.  Schizoaffective disorder: Managed by primary team.  FINAL MEDICATION LIST END OF ENCOUNTER: No orders of the defined types were placed in this encounter.    Current Outpatient Medications:    aspirin EC 81 MG tablet, Take 1 tablet (81 mg total) by mouth daily. Swallow  whole., Disp: 90 tablet, Rfl: 3   benztropine (COGENTIN) 0.5 MG tablet, Take 1 tablet by mouth 2 (two) times daily., Disp: , Rfl:    divalproex (DEPAKOTE) 500 MG DR tablet, Take 1 tablet (500 mg total) by mouth every morning. (Take 1 tablet (500 mg) by mouth in the mornings & 2 tablets (1,000 mg) at bedtime: For mood stabilization, Disp: 90 tablet, Rfl: 0   metFORMIN (GLUCOPHAGE XR) 500 MG 24 hr tablet, Take 2 tablets (1,000 mg total) by mouth daily with breakfast., Disp: 180 tablet, Rfl: 0   metoprolol succinate (TOPROL-XL) 50 MG 24 hr tablet, TAKE 1 TABLET BY MOUTH EVERY DAY, Disp: 90 tablet, Rfl: 0   OLANZapine (ZYPREXA) 20 MG tablet, Take 1 tablet (20 mg total) by mouth at bedtime. For mood control, Disp: 30 tablet, Rfl: 0  Orders Placed This Encounter  Procedures   EKG 12-Lead    --Continue cardiac medications as reconciled in final medication list. --Return in about 1 year (around 07/02/2022) for Follow up PSVT and PVCs. . Or sooner if needed. --Continue follow-up with your primary care physician regarding the management of your other chronic comorbid conditions.  Patient's questions and concerns were addressed to his satisfaction. He voices understanding of the instructions provided during this encounter.   This note was created using a voice recognition software as a result there may be grammatical errors inadvertently enclosed that do not reflect the nature of this encounter. Every attempt is made to correct such errors.  Rex Kras, [place], [facility]  Pager: [phone] Office: [phone]

## 2021-07-04 ENCOUNTER — Ambulatory Visit: Payer: Medicare Other | Admitting: Cardiology

## 2021-08-28 NOTE — Progress Notes (Signed)
Erroneous encounter

## 2021-08-31 ENCOUNTER — Other Ambulatory Visit: Payer: Self-pay

## 2021-08-31 ENCOUNTER — Emergency Department (HOSPITAL_COMMUNITY)
Admission: EM | Admit: 2021-08-31 | Discharge: 2021-08-31 | Disposition: A | Discharge status: Home / Self Care | Payer: Medicare Other | Attending: Emergency Medicine | Admitting: Emergency Medicine

## 2021-08-31 ENCOUNTER — Emergency Department (HOSPITAL_COMMUNITY): Payer: Medicare Other

## 2021-08-31 DIAGNOSIS — Z7982 Long term (current) use of aspirin: Secondary | ICD-10-CM | POA: Insufficient documentation

## 2021-08-31 DIAGNOSIS — R051 Acute cough: Secondary | ICD-10-CM | POA: Diagnosis present

## 2021-08-31 DIAGNOSIS — Z5900 Homelessness unspecified: Secondary | ICD-10-CM | POA: Insufficient documentation

## 2021-08-31 DIAGNOSIS — Z20822 Contact with and (suspected) exposure to covid-19: Secondary | ICD-10-CM | POA: Diagnosis not present

## 2021-08-31 DIAGNOSIS — J189 Pneumonia, unspecified organism: Secondary | ICD-10-CM | POA: Diagnosis not present

## 2021-08-31 DIAGNOSIS — Z7984 Long term (current) use of oral hypoglycemic drugs: Secondary | ICD-10-CM | POA: Insufficient documentation

## 2021-08-31 DIAGNOSIS — R059 Cough, unspecified: Secondary | ICD-10-CM

## 2021-08-31 DIAGNOSIS — R0602 Shortness of breath: Secondary | ICD-10-CM | POA: Insufficient documentation

## 2021-08-31 DIAGNOSIS — E119 Type 2 diabetes mellitus without complications: Secondary | ICD-10-CM | POA: Insufficient documentation

## 2021-08-31 DIAGNOSIS — Z79899 Other long term (current) drug therapy: Secondary | ICD-10-CM | POA: Diagnosis not present

## 2021-08-31 LAB — BASIC METABOLIC PANEL
Anion gap: 7 (ref 5–15)
BUN: 18 mg/dL (ref 8–23)
CO2: 28 mmol/L (ref 22–32)
Calcium: 8.6 mg/dL — ABNORMAL LOW (ref 8.9–10.3)
Chloride: 98 mmol/L (ref 98–111)
Creatinine, Ser: 0.67 mg/dL (ref 0.61–1.24)
GFR, Estimated: >60 mL/min (ref >60)
Glucose, Bld: 204 mg/dL — ABNORMAL HIGH (ref 70–99)
Potassium: 3.8 mmol/L (ref 3.5–5.1)
Sodium: 133 mmol/L — ABNORMAL LOW (ref 135–145)

## 2021-08-31 LAB — CBC WITH DIFFERENTIAL/PLATELET
Abs Immature Granulocytes: 0.02 10*3/uL (ref 0.00–0.07)
Basophils Absolute: 0 10*3/uL (ref 0.0–0.1)
Basophils Relative: 0 %
Eosinophils Absolute: 0.1 10*3/uL (ref 0.0–0.5)
Eosinophils Relative: 1 %
HCT: 37.9 % — ABNORMAL LOW (ref 39.0–52.0)
Hemoglobin: 12.6 g/dL — ABNORMAL LOW (ref 13.0–17.0)
Immature Granulocytes: 0 %
Lymphocytes Relative: 14 %
Lymphs Abs: 1.1 10*3/uL (ref 0.7–4.0)
MCH: 31.3 pg (ref 26.0–34.0)
MCHC: 33.2 g/dL (ref 30.0–36.0)
MCV: 94.3 fL (ref 80.0–100.0)
Monocytes Absolute: 0.9 10*3/uL (ref 0.1–1.0)
Monocytes Relative: 11 %
Neutro Abs: 5.7 10*3/uL (ref 1.7–7.7)
Neutrophils Relative %: 74 %
Platelets: 98 10*3/uL — ABNORMAL LOW (ref 150–400)
RBC: 4.02 MIL/uL — ABNORMAL LOW (ref 4.22–5.81)
RDW: 15.9 % — ABNORMAL HIGH (ref 11.5–15.5)
WBC: 7.8 10*3/uL (ref 4.0–10.5)
nRBC: 0 % (ref 0.0–0.2)

## 2021-08-31 LAB — LACTIC ACID, PLASMA
Lactic Acid, Venous: 1.6 mmol/L (ref 0.5–1.9)
Lactic Acid, Venous: 1.3 mmol/L (ref 0.5–1.9)

## 2021-08-31 LAB — RESP PANEL BY RT-PCR (FLU A&B, COVID) ARPGX2
Influenza A by PCR: NEGATIVE
Influenza B by PCR: NEGATIVE
SARS Coronavirus 2 by RT PCR: NEGATIVE

## 2021-08-31 MED ORDER — SODIUM CHLORIDE 0.9 % IV BOLUS
1000.0000 mL | Freq: Once | INTRAVENOUS | Status: AC
  Administered 2021-08-31: 1000 mL via INTRAVENOUS

## 2021-08-31 NOTE — Progress Notes (Addendum)
Shelter resources and information about the [facility] are attached to patients AVS. CSW also spoke with patients friend Ms. Rosana Hoes who is listed on his chart. Ms. Rosana Hoes stated she has not spoken to patient in awhile but knows where he was living didn't work out. Ms. Rosana Hoes stated she better leave it at that as it didn't work out.

## 2021-08-31 NOTE — ED Triage Notes (Signed)
PD called EMS for pt who was found shivering outside. Pt said they think they have pneumonia. Pt seems to have some disconnected thoughts.  Pt has been sleeping outside for 2x, was kicked out of home.   AOX3  BP: 100/58 HR: 140 SPO2: 98 RA CBG: 123

## 2021-08-31 NOTE — Discharge Instructions (Addendum)
[place] [facility] [place] Hours: Monday-Friday 8:00 AM-3:00 PM  The [facility] provides Warming centers at night when the temperature drops to a certain temperature.    Please return to the emergency department for worsening symptoms.

## 2021-08-31 NOTE — ED Provider Notes (Signed)
[place] DEPT Provider Note   CSN: [id number] Arrival date & time: 08/31/21  1941     History Chief Complaint  Patient presents with   Homeless   possible pneumonia     Frederick Fletcher is a 61 y.o. male with medical history listed below who presents to the emergency department with cough and what he describes as pneumonia.  Patient states he was evicted from his home 2 days ago and had to sleep outside by a dumpster.  He did not have any warm blankets.  He is having some mild shortness of breath.  No chest pain.  He is not taking any medications for his chronic comorbidities.  He denies any drug use.  No abdominal symptoms.  No urinary complaints.  Patient Active Problem List   Diagnosis Date Noted   Diabetes mellitus, type 2 ([place]) 05/28/2021   SVT (supraventricular tachycardia) ([place]) 04/05/2020   Typical atrial flutter ([place]) 04/05/2020   Schizoaffective disorder, bipolar type ([place]) 12/24/2017     HPI     Home Medications Prior to Admission medications   Medication Sig Start Date End Date Taking? Authorizing Provider  aspirin EC 81 MG tablet Take 1 tablet (81 mg total) by mouth daily. Swallow whole. 03/29/20   Tolia, Sunit, DO  benztropine (COGENTIN) 0.5 MG tablet Take 1 tablet by mouth 2 (two) times daily. 03/02/20   [provider]  divalproex (DEPAKOTE) 500 MG DR tablet Take 1 tablet (500 mg total) by mouth every morning. (Take 1 tablet (500 mg) by mouth in the mornings & 2 tablets (1,000 mg) at bedtime: For mood stabilization 01/06/18   Lindell Spar I, NP  metFORMIN (GLUCOPHAGE XR) 500 MG 24 hr tablet Take 2 tablets (1,000 mg total) by mouth daily with breakfast. 05/29/21 08/27/21  Camillia Herter, NP  metoprolol succinate (TOPROL-XL) 50 MG 24 hr tablet TAKE 1 TABLET BY MOUTH EVERY DAY 06/24/20   Tolia, Sunit, DO  OLANZapine (ZYPREXA) 20 MG tablet Take 1 tablet (20 mg total) by mouth at bedtime. For mood control 01/05/18   Encarnacion Slates, NP      Allergies    Patient has no known allergies.    Review of Systems   Review of Systems  All other systems reviewed and are negative.  Physical Exam Updated Vital Signs BP 119/79    Pulse (!) 56    Temp 97.6 F (36.4 C) (Oral)    Resp 19    SpO2 100%  Physical Exam Vitals and nursing note reviewed.  Constitutional:      General: He is not in acute distress.    Appearance: Normal appearance.  HENT:     Head: Normocephalic and atraumatic.  Eyes:     General:        Right eye: No discharge.        Left eye: No discharge.  Cardiovascular:     Comments: Regular rate and rhythm.  S1/S2 are distinct without any evidence of murmur, rubs, or gallops.  Radial pulses are 2+ bilaterally.  Dorsalis pedis pulses are 2+ bilaterally.  No evidence of pedal edema. Pulmonary:     Comments: Normal effort.  No respiratory distress.  Coarse breath sounds. Abdominal:     General: Abdomen is flat. Bowel sounds are normal. There is no distension.     Tenderness: There is no abdominal tenderness. There is no guarding or rebound.  Musculoskeletal:        General: Normal range of  motion.     Cervical back: Neck supple.  Skin:    General: Skin is warm and dry.     Findings: No rash.  Neurological:     General: No focal deficit present.     Mental Status: He is alert.  Psychiatric:        Mood and Affect: Mood normal.        Behavior: Behavior normal.    ED Results / Procedures / Treatments   Labs (all labs ordered are listed, but only abnormal results are displayed) Labs Reviewed  CBC WITH DIFFERENTIAL/PLATELET - Abnormal; Notable for the following components:      Result Value   RBC 4.02 (*)    Hemoglobin 12.6 (*)    HCT 37.9 (*)    RDW 15.9 (*)    Platelets 98 (*)    All other components within normal limits  BASIC METABOLIC PANEL - Abnormal; Notable for the following components:   Sodium 133 (*)    Glucose, Bld 204 (*)    Calcium 8.6 (*)    All other components  within normal limits  RESP PANEL BY RT-PCR (FLU A&B, COVID) ARPGX2  LACTIC ACID, PLASMA  LACTIC ACID, PLASMA    EKG None  Radiology DG Chest 2 View  Result Date: 08/31/2021 CLINICAL DATA:  61 year old male found sleeping outside, cough and shortness of breath. Possible hypothermia. EXAM: CHEST - 2 VIEW COMPARISON:  Chest radiographs 01/21/2008 and earlier. FINDINGS: Normal lung volumes and mediastinal contours. Possible attenuation of upper lobe bronchovascular markings raising the possibility of some upper lobe emphysema, but elsewhere the lungs appear clear and within normal limits. No pneumothorax or pleural effusion. No acute osseous abnormality identified. Negative visible bowel gas. IMPRESSION: No acute cardiopulmonary abnormality. Electronically Signed   By: Genevie Ann M.D.   On: 08/31/2021 10:56    Procedures Procedures  Patient is hypertensive on cardiac telemetry.  Heart rate is also in the 120s.  Oxygenating well on room air.  Medications Ordered in ED Medications  sodium chloride 0.9 % bolus 1,000 mL (1,000 mLs Intravenous New Bag/Given 08/31/21 1113)    ED Course/ Medical Decision Making/ A&P                           Medical Decision Making Amount and/or Complexity of Data Reviewed Labs: ordered. Radiology: ordered.   Frederick Fletcher is a 61 y.o. male with significant comorbidities to impact his care today to include diabetes, SVT, and hypertension who presents to the emergency department with homelessness and cough and shortness of breath.  Differential diagnosis includes pneumonia, allergic etiology, viral etiology, URI.  We will plan to get labs, imaging, and bolus him with a liter of fluid considering that he has tachycardic.  Patient was significantly hypertensive.  If this persists I will likely give him medications.  No chest pain.  Have a low suspicion for ACS at this time.  We will plan to reassess.  I reviewed the labs and imaging.  CBC without any evidence of  leukocytosis.  Did show chronic ongoing anemia.  BMP was without any significant abnormality apart from elevated glucose.  Respiratory panel was negative for COVID and flu. First and second lactic were normal.  Chest x-ray did not show any signs of pneumonia.   Given the clinical scenario, I do believe the patient is safe for outpatient follow-up.  I spoke with social work who came and evaluate the patient at  bedside to help him with resources for his homelessness.  I discussed this with the patient.  He was amenable to this plan.  Appropriate resources were provided.  All questions or concerns addressed.  He is safe for discharge.   Final Clinical Impression(s) / ED Diagnoses Final diagnoses:  Acute cough    Rx / DC Orders ED Discharge Orders     None         Hendricks Limes, [place] 09/02/21 1525    Malvin Johns, MD 09/04/21 [phone]

## 2021-09-02 ENCOUNTER — Encounter: Payer: Medicaid Other | Admitting: Family

## 2021-09-02 DIAGNOSIS — E119 Type 2 diabetes mellitus without complications: Secondary | ICD-10-CM

## 2022-07-02 ENCOUNTER — Ambulatory Visit: Payer: Self-pay | Admitting: Cardiology

## 2023-06-23 IMAGING — CR DG CHEST 2V
2 series · 2 of 2 positions shown · non-contrast
Comparison: Chest radiographs 01/21/2008 and earlier.

CLINICAL DATA: 60-year-old male found sleeping outside, cough and
shortness of breath. Possible hypothermia.

EXAM:
CHEST - 2 VIEW

[w chest pa]
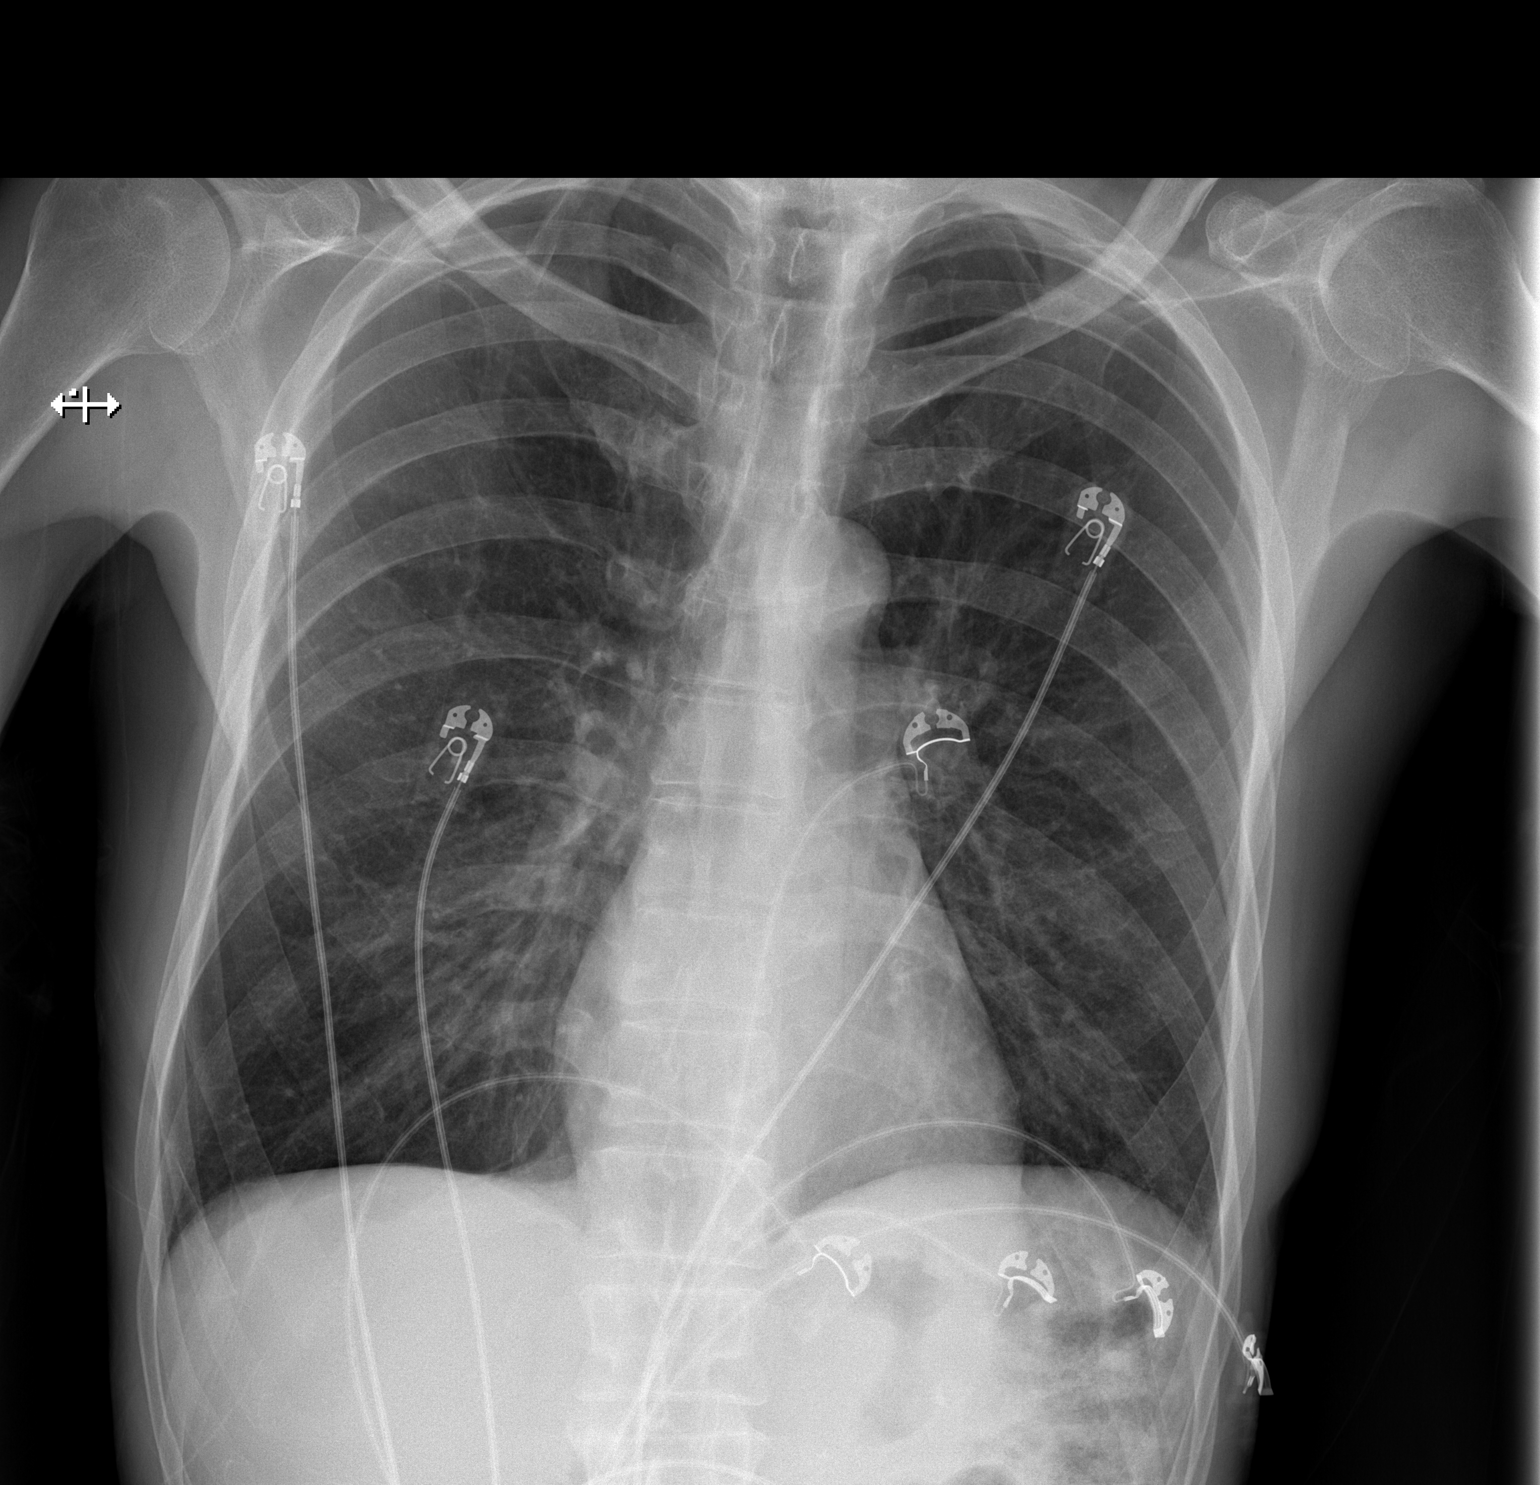

[w chest lat]
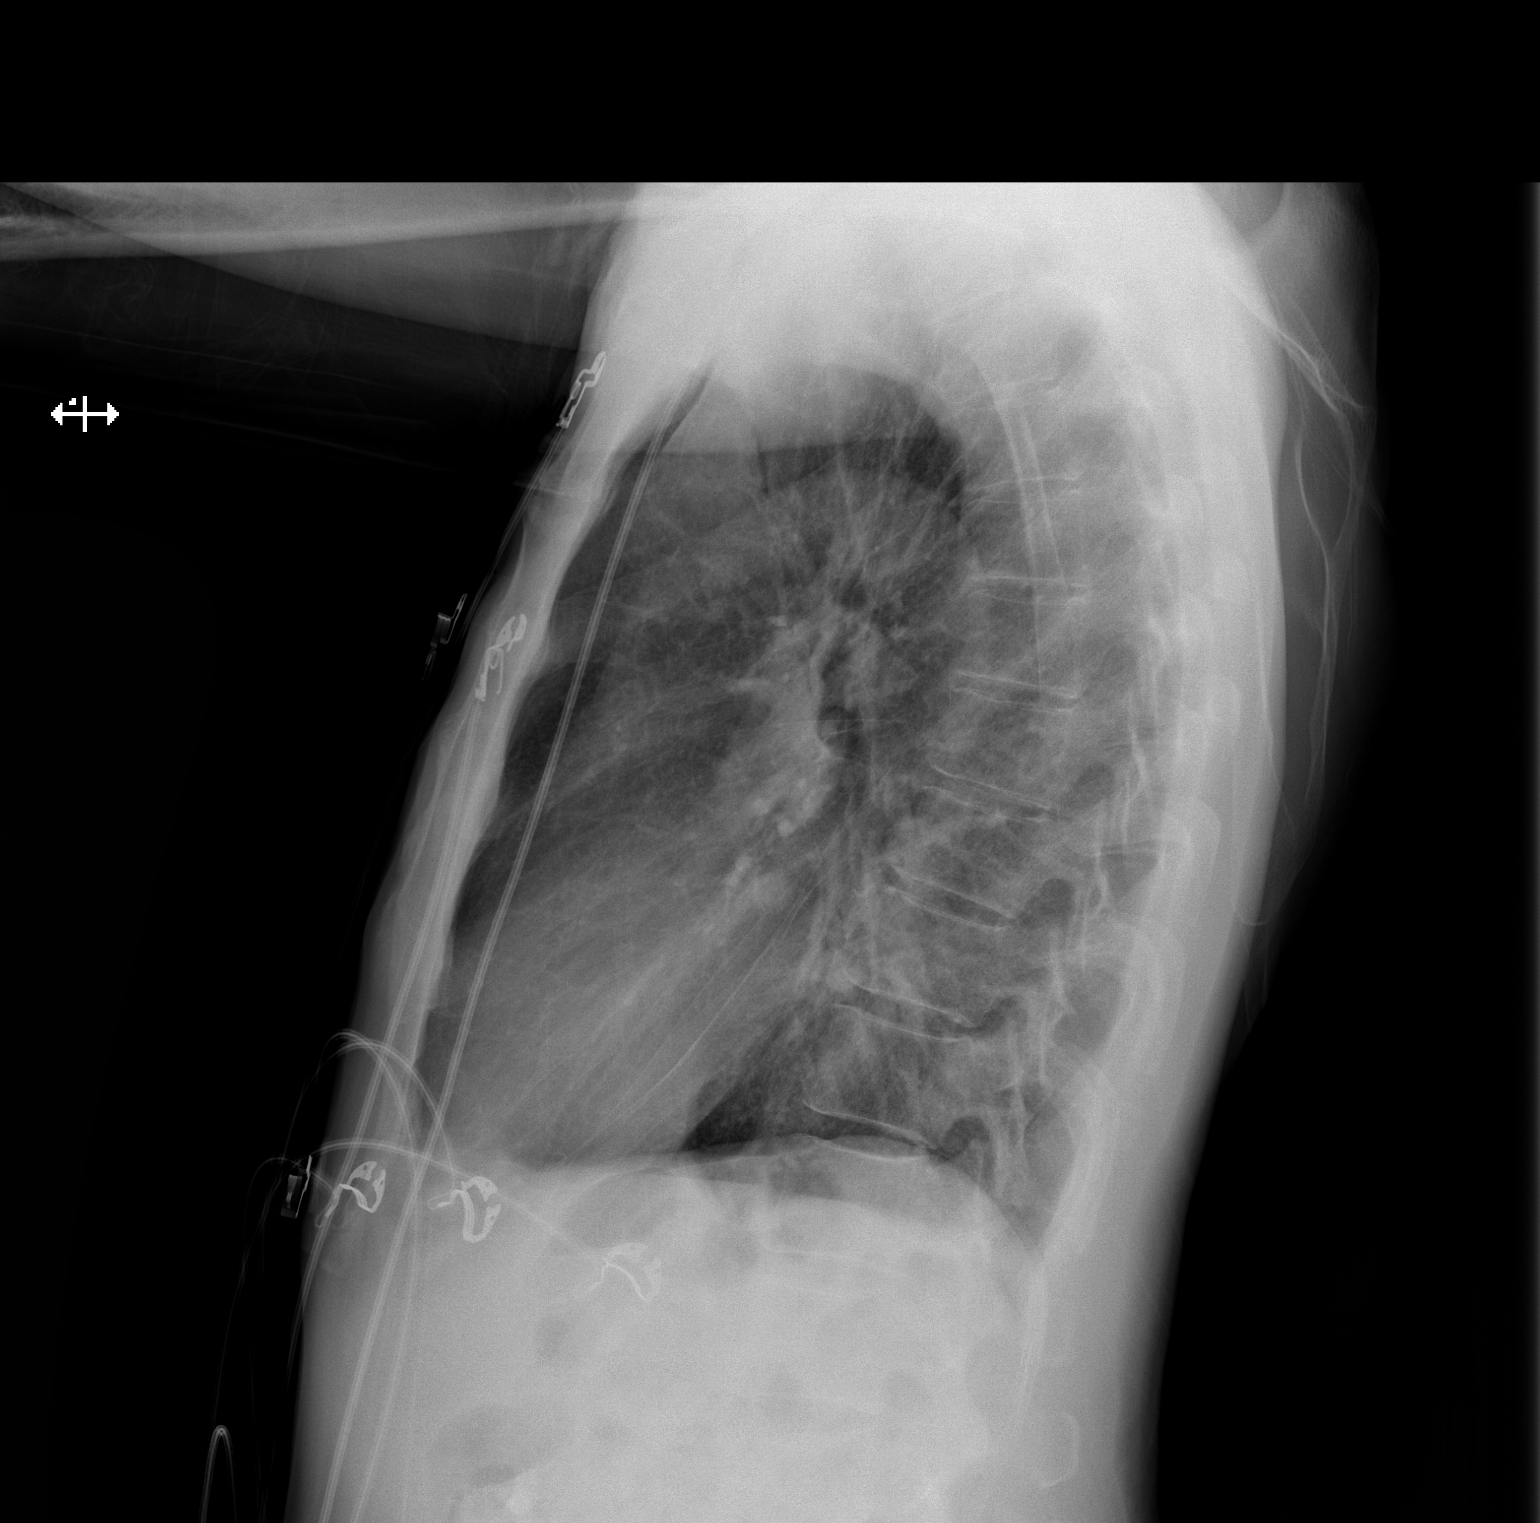

[2 of 2 positions shown; findings below may reference images not displayed]

FINDINGS: Normal lung volumes and mediastinal contours. Possible attenuation
of upper lobe bronchovascular markings raising the possibility of
some upper lobe emphysema, but elsewhere the lungs appear clear and
within normal limits. No pneumothorax or pleural effusion. No acute
osseous abnormality identified. Negative visible bowel gas.
IMPRESSION: No acute cardiopulmonary abnormality.
